# Patient Record
Sex: Female | Born: 1965
Health system: Southern US, Community
[De-identification: ages and names within clinical notes are randomized; demographics above are authoritative.]

## PROBLEM LIST (undated history)

## (undated) DIAGNOSIS — I10 Essential (primary) hypertension: Secondary | ICD-10-CM

## (undated) DIAGNOSIS — E785 Hyperlipidemia, unspecified: Secondary | ICD-10-CM

## (undated) DIAGNOSIS — F909 Attention-deficit hyperactivity disorder, unspecified type: Secondary | ICD-10-CM

## (undated) DIAGNOSIS — C801 Malignant (primary) neoplasm, unspecified: Secondary | ICD-10-CM

## (undated) DIAGNOSIS — I82452 Acute embolism and thrombosis of left peroneal vein: Secondary | ICD-10-CM

## (undated) DIAGNOSIS — G43909 Migraine, unspecified, not intractable, without status migrainosus: Secondary | ICD-10-CM

## (undated) DIAGNOSIS — E890 Postprocedural hypothyroidism: Secondary | ICD-10-CM

## (undated) HISTORY — DX: Attention-deficit hyperactivity disorder, unspecified type: F90.9

## (undated) HISTORY — DX: Acute embolism and thrombosis of left peroneal vein: I82.452

## (undated) HISTORY — PX: APPENDECTOMY: SHX54

## (undated) HISTORY — DX: Hyperlipidemia, unspecified: E78.5

## (undated) HISTORY — DX: Essential (primary) hypertension: I10

## (undated) HISTORY — DX: Malignant (primary) neoplasm, unspecified: C80.1

## (undated) HISTORY — DX: Migraine, unspecified, not intractable, without status migrainosus: G43.909

## (undated) HISTORY — PX: BREAST CYST ASPIRATION: SHX578

## (undated) HISTORY — DX: Postprocedural hypothyroidism: E89.0

## (undated) HISTORY — PX: LASIK: SHX215

---

## 1998-03-18 ENCOUNTER — Ambulatory Visit (HOSPITAL_COMMUNITY): Admission: RE | Admit: 1998-03-18 | Discharge: 1998-03-18 | Payer: Self-pay | Admitting: Gynecology

## 1998-12-29 ENCOUNTER — Other Ambulatory Visit: Admission: RE | Admit: 1998-12-29 | Discharge: 1998-12-29 | Payer: Self-pay | Admitting: Gynecology

## 1999-12-07 ENCOUNTER — Encounter: Admission: RE | Admit: 1999-12-07 | Discharge: 1999-12-07 | Payer: Self-pay | Admitting: Emergency Medicine

## 1999-12-07 ENCOUNTER — Encounter: Payer: Self-pay | Admitting: Emergency Medicine

## 2000-02-03 ENCOUNTER — Other Ambulatory Visit: Admission: RE | Admit: 2000-02-03 | Discharge: 2000-02-03 | Payer: Self-pay | Admitting: Obstetrics and Gynecology

## 2001-02-08 ENCOUNTER — Other Ambulatory Visit: Admission: RE | Admit: 2001-02-08 | Discharge: 2001-02-08 | Payer: Self-pay | Admitting: Gynecology

## 2002-03-20 ENCOUNTER — Other Ambulatory Visit: Admission: RE | Admit: 2002-03-20 | Discharge: 2002-03-20 | Payer: Self-pay | Admitting: Gynecology

## 2002-11-02 ENCOUNTER — Encounter: Payer: Self-pay | Admitting: Gynecology

## 2002-11-02 ENCOUNTER — Inpatient Hospital Stay (HOSPITAL_COMMUNITY): Admission: AD | Admit: 2002-11-02 | Discharge: 2002-11-02 | Payer: Self-pay | Admitting: Gynecology

## 2003-01-09 ENCOUNTER — Ambulatory Visit (HOSPITAL_COMMUNITY): Admission: RE | Admit: 2003-01-09 | Discharge: 2003-01-09 | Payer: Self-pay | Admitting: Gynecology

## 2003-03-17 ENCOUNTER — Ambulatory Visit (HOSPITAL_COMMUNITY): Admission: RE | Admit: 2003-03-17 | Discharge: 2003-03-17 | Payer: Self-pay | Admitting: Gynecology

## 2003-03-17 ENCOUNTER — Encounter (INDEPENDENT_AMBULATORY_CARE_PROVIDER_SITE_OTHER): Payer: Self-pay | Admitting: Specialist

## 2003-09-02 ENCOUNTER — Other Ambulatory Visit: Admission: RE | Admit: 2003-09-02 | Discharge: 2003-09-02 | Payer: Self-pay | Admitting: Gynecology

## 2003-09-30 ENCOUNTER — Inpatient Hospital Stay (HOSPITAL_COMMUNITY): Admission: AD | Admit: 2003-09-30 | Discharge: 2003-09-30 | Payer: Self-pay | Admitting: Gynecology

## 2004-02-19 ENCOUNTER — Inpatient Hospital Stay (HOSPITAL_COMMUNITY): Admission: RE | Admit: 2004-02-19 | Discharge: 2004-02-22 | Payer: Self-pay | Admitting: Gynecology

## 2004-02-23 ENCOUNTER — Encounter: Admission: RE | Admit: 2004-02-23 | Discharge: 2004-03-24 | Payer: Self-pay | Admitting: Gynecology

## 2004-03-25 ENCOUNTER — Encounter: Admission: RE | Admit: 2004-03-25 | Discharge: 2004-04-24 | Payer: Self-pay | Admitting: Gynecology

## 2004-04-07 ENCOUNTER — Other Ambulatory Visit: Admission: RE | Admit: 2004-04-07 | Discharge: 2004-04-07 | Payer: Self-pay | Admitting: Gynecology

## 2004-11-01 ENCOUNTER — Ambulatory Visit: Payer: Self-pay | Admitting: Internal Medicine

## 2004-11-09 ENCOUNTER — Ambulatory Visit: Payer: Self-pay | Admitting: Internal Medicine

## 2005-05-16 ENCOUNTER — Other Ambulatory Visit: Admission: RE | Admit: 2005-05-16 | Discharge: 2005-05-16 | Payer: Self-pay | Admitting: Gynecology

## 2005-09-03 ENCOUNTER — Inpatient Hospital Stay (HOSPITAL_COMMUNITY): Admission: AD | Admit: 2005-09-03 | Discharge: 2005-09-03 | Payer: Self-pay | Admitting: Gynecology

## 2005-10-11 ENCOUNTER — Ambulatory Visit: Payer: Self-pay | Admitting: Internal Medicine

## 2005-11-21 ENCOUNTER — Inpatient Hospital Stay (HOSPITAL_COMMUNITY): Admission: RE | Admit: 2005-11-21 | Discharge: 2005-11-24 | Payer: Self-pay | Admitting: Gynecology

## 2006-01-08 ENCOUNTER — Other Ambulatory Visit: Admission: RE | Admit: 2006-01-08 | Discharge: 2006-01-08 | Payer: Self-pay | Admitting: Gynecology

## 2006-02-08 ENCOUNTER — Ambulatory Visit: Payer: Self-pay | Admitting: Family Medicine

## 2006-03-14 ENCOUNTER — Ambulatory Visit: Payer: Self-pay | Admitting: Internal Medicine

## 2006-04-18 ENCOUNTER — Ambulatory Visit: Payer: Self-pay | Admitting: Internal Medicine

## 2006-06-21 ENCOUNTER — Ambulatory Visit: Payer: Self-pay | Admitting: Internal Medicine

## 2006-07-25 ENCOUNTER — Ambulatory Visit: Payer: Self-pay | Admitting: Internal Medicine

## 2007-01-25 ENCOUNTER — Ambulatory Visit: Payer: Self-pay | Admitting: Internal Medicine

## 2007-03-01 ENCOUNTER — Other Ambulatory Visit: Admission: RE | Admit: 2007-03-01 | Discharge: 2007-03-01 | Payer: Self-pay | Admitting: Gynecology

## 2007-05-01 ENCOUNTER — Ambulatory Visit: Payer: Self-pay | Admitting: Internal Medicine

## 2007-05-01 ENCOUNTER — Encounter: Payer: Self-pay | Admitting: Internal Medicine

## 2007-09-03 ENCOUNTER — Telehealth (INDEPENDENT_AMBULATORY_CARE_PROVIDER_SITE_OTHER): Payer: Self-pay | Admitting: *Deleted

## 2007-09-03 ENCOUNTER — Ambulatory Visit: Payer: Self-pay | Admitting: Family Medicine

## 2008-02-28 ENCOUNTER — Emergency Department (HOSPITAL_COMMUNITY): Admission: EM | Admit: 2008-02-28 | Discharge: 2008-02-28 | Payer: Self-pay | Admitting: Family Medicine

## 2008-02-28 ENCOUNTER — Telehealth (INDEPENDENT_AMBULATORY_CARE_PROVIDER_SITE_OTHER): Payer: Self-pay | Admitting: *Deleted

## 2008-06-12 ENCOUNTER — Other Ambulatory Visit: Admission: RE | Admit: 2008-06-12 | Discharge: 2008-06-12 | Payer: Self-pay | Admitting: Gynecology

## 2008-07-23 ENCOUNTER — Encounter: Admission: RE | Admit: 2008-07-23 | Discharge: 2008-07-23 | Payer: Self-pay | Admitting: Gynecology

## 2009-03-05 ENCOUNTER — Ambulatory Visit: Payer: Self-pay | Admitting: Internal Medicine

## 2009-03-05 DIAGNOSIS — R1011 Right upper quadrant pain: Secondary | ICD-10-CM | POA: Insufficient documentation

## 2009-03-05 DIAGNOSIS — R635 Abnormal weight gain: Secondary | ICD-10-CM | POA: Insufficient documentation

## 2009-03-08 ENCOUNTER — Encounter (INDEPENDENT_AMBULATORY_CARE_PROVIDER_SITE_OTHER): Payer: Self-pay | Admitting: *Deleted

## 2009-03-08 LAB — CONVERTED CEMR LAB
ALT: 15 units/L (ref 0–35)
Bilirubin, Direct: 0.1 mg/dL (ref 0.0–0.3)
Free T4: 0.8 ng/dL (ref 0.6–1.6)
T3, Free: 2.7 pg/mL (ref 2.3–4.2)
TSH: 1.23 microintl units/mL (ref 0.35–5.50)
Total Bilirubin: 0.8 mg/dL (ref 0.3–1.2)

## 2009-03-11 ENCOUNTER — Encounter: Admission: RE | Admit: 2009-03-11 | Discharge: 2009-03-11 | Payer: Self-pay | Admitting: Internal Medicine

## 2009-03-12 ENCOUNTER — Telehealth (INDEPENDENT_AMBULATORY_CARE_PROVIDER_SITE_OTHER): Payer: Self-pay | Admitting: *Deleted

## 2009-03-12 ENCOUNTER — Encounter (INDEPENDENT_AMBULATORY_CARE_PROVIDER_SITE_OTHER): Payer: Self-pay | Admitting: *Deleted

## 2009-03-23 ENCOUNTER — Telehealth: Payer: Self-pay | Admitting: Internal Medicine

## 2009-03-23 DIAGNOSIS — C73 Malignant neoplasm of thyroid gland: Secondary | ICD-10-CM | POA: Insufficient documentation

## 2009-04-02 ENCOUNTER — Encounter (INDEPENDENT_AMBULATORY_CARE_PROVIDER_SITE_OTHER): Payer: Self-pay | Admitting: *Deleted

## 2009-05-06 ENCOUNTER — Encounter: Payer: Self-pay | Admitting: Internal Medicine

## 2009-05-21 ENCOUNTER — Encounter (INDEPENDENT_AMBULATORY_CARE_PROVIDER_SITE_OTHER): Payer: Self-pay | Admitting: Interventional Radiology

## 2009-05-21 ENCOUNTER — Encounter: Payer: Self-pay | Admitting: Internal Medicine

## 2009-05-21 ENCOUNTER — Ambulatory Visit (HOSPITAL_COMMUNITY): Admission: RE | Admit: 2009-05-21 | Discharge: 2009-05-21 | Payer: Self-pay | Admitting: Endocrinology

## 2009-06-03 ENCOUNTER — Telehealth: Payer: Self-pay | Admitting: Internal Medicine

## 2009-06-10 ENCOUNTER — Telehealth (INDEPENDENT_AMBULATORY_CARE_PROVIDER_SITE_OTHER): Payer: Self-pay | Admitting: *Deleted

## 2009-06-17 ENCOUNTER — Encounter: Payer: Self-pay | Admitting: Internal Medicine

## 2009-06-22 ENCOUNTER — Telehealth (INDEPENDENT_AMBULATORY_CARE_PROVIDER_SITE_OTHER): Payer: Self-pay | Admitting: *Deleted

## 2009-07-25 HISTORY — PX: THYROIDECTOMY: SHX17

## 2009-08-04 ENCOUNTER — Encounter: Payer: Self-pay | Admitting: Internal Medicine

## 2009-08-26 ENCOUNTER — Encounter: Payer: Self-pay | Admitting: Internal Medicine

## 2009-09-07 ENCOUNTER — Encounter: Admission: RE | Admit: 2009-09-07 | Discharge: 2009-09-07 | Payer: Self-pay | Admitting: Gynecology

## 2009-09-16 ENCOUNTER — Telehealth (INDEPENDENT_AMBULATORY_CARE_PROVIDER_SITE_OTHER): Payer: Self-pay | Admitting: *Deleted

## 2009-09-29 ENCOUNTER — Ambulatory Visit: Payer: Self-pay | Admitting: Internal Medicine

## 2009-09-29 ENCOUNTER — Telehealth (INDEPENDENT_AMBULATORY_CARE_PROVIDER_SITE_OTHER): Payer: Self-pay | Admitting: *Deleted

## 2009-09-30 ENCOUNTER — Encounter (INDEPENDENT_AMBULATORY_CARE_PROVIDER_SITE_OTHER): Payer: Self-pay | Admitting: *Deleted

## 2009-09-30 ENCOUNTER — Telehealth (INDEPENDENT_AMBULATORY_CARE_PROVIDER_SITE_OTHER): Payer: Self-pay | Admitting: *Deleted

## 2009-09-30 LAB — CONVERTED CEMR LAB: TSH: 1.93 microintl units/mL (ref 0.35–5.50)

## 2009-10-01 ENCOUNTER — Telehealth: Payer: Self-pay | Admitting: Internal Medicine

## 2009-10-20 ENCOUNTER — Encounter: Payer: Self-pay | Admitting: Internal Medicine

## 2009-12-23 ENCOUNTER — Encounter: Payer: Self-pay | Admitting: Internal Medicine

## 2010-02-03 ENCOUNTER — Telehealth (INDEPENDENT_AMBULATORY_CARE_PROVIDER_SITE_OTHER): Payer: Self-pay | Admitting: *Deleted

## 2010-05-11 ENCOUNTER — Encounter: Payer: Self-pay | Admitting: Internal Medicine

## 2010-11-09 ENCOUNTER — Encounter: Payer: Self-pay | Admitting: Internal Medicine

## 2011-01-16 ENCOUNTER — Encounter: Payer: Self-pay | Admitting: Endocrinology

## 2011-01-24 NOTE — Progress Notes (Signed)
Summary: Lab Concerns  Phone Note Call from Patient Call back at Home Phone [phone] Call back at Work Phone [phone]   Caller: Patient Summary of Call: Message left on VM: Patient left message that she faxed over labs and Dr.Hopper was to review and get back with her about them.  Dr.Hopper I see that you signed labs and they were scanned, would you re-review and inform if patient needs appointment to futher discuss or if you have any comments/recommendations.  Marland KitchenShonna Chock  February 03, 2010 12:31 PM   Follow-up for Phone Call        Dr.Hopper called patient and said " several concerns, chlosterol, vit D and she needs appointment if she would like to futher discuss or have the Dr. that ordered them addressed" Follow-up by: Shonna Chock,  February 03, 2010 12:33 PM     Appended Document: Lab Concerns I reviewed these labs & the comments  made by ? Dr Consuelo Pandy of [facility] who ordered these labs. That practitioner made recommendations concerning the results which were out of normal range. The Standard of Care is for the physician who orderds labs to address any abnormalities. I recommend she make appt with Dr Consuelo Pandy , Dr Talmage Nap, Endocrinologist, or with me to discuss any risks & options in person to prevent duplication or misunderstanding. Her health is too important to attempt  to address this by phone. [employer]

## 2011-01-24 NOTE — Letter (Signed)
Summary: [facility]aryng[facility]  [facility]olaryng[facility]   Imported By: Lanelle Bal 05/28/2010 09:51:52  _____________________________________________________________________  External Attachment:    Type:   Image     Comment:   External Document

## 2011-01-24 NOTE — Letter (Signed)
Summary: [facility] Otolaryngology  [facility] Otolaryng[facility]   Imported By: Lanelle Bal 11/25/2010 12:03:11  _____________________________________________________________________  External Attachment:    Type:   Image     Comment:   External Document

## 2011-02-01 ENCOUNTER — Other Ambulatory Visit: Payer: Self-pay | Admitting: Gynecology

## 2011-02-01 DIAGNOSIS — Z1231 Encounter for screening mammogram for malignant neoplasm of breast: Secondary | ICD-10-CM

## 2011-02-08 ENCOUNTER — Ambulatory Visit: Payer: Self-pay

## 2011-02-09 ENCOUNTER — Other Ambulatory Visit: Payer: Self-pay | Admitting: Women's Health

## 2011-02-09 ENCOUNTER — Ambulatory Visit (INDEPENDENT_AMBULATORY_CARE_PROVIDER_SITE_OTHER): Payer: BC Managed Care – PPO | Admitting: Women's Health

## 2011-02-09 ENCOUNTER — Other Ambulatory Visit (HOSPITAL_COMMUNITY): Admission: RE | Admit: 2011-02-09 | Payer: BC Managed Care – PPO | Source: Ambulatory Visit | Admitting: Gynecology

## 2011-02-09 DIAGNOSIS — Z124 Encounter for screening for malignant neoplasm of cervix: Secondary | ICD-10-CM | POA: Insufficient documentation

## 2011-02-09 DIAGNOSIS — Z01419 Encounter for gynecological examination (general) (routine) without abnormal findings: Secondary | ICD-10-CM

## 2011-02-14 ENCOUNTER — Ambulatory Visit
Admission: RE | Admit: 2011-02-14 | Discharge: 2011-02-14 | Disposition: A | Discharge status: Home / Self Care | Payer: BC Managed Care – PPO | Source: Ambulatory Visit | Attending: Gynecology | Admitting: Gynecology

## 2011-02-14 DIAGNOSIS — Z1231 Encounter for screening mammogram for malignant neoplasm of breast: Secondary | ICD-10-CM

## 2011-02-17 ENCOUNTER — Other Ambulatory Visit: Payer: Self-pay | Admitting: Gynecology

## 2011-02-17 DIAGNOSIS — R928 Other abnormal and inconclusive findings on diagnostic imaging of breast: Secondary | ICD-10-CM

## 2011-02-22 ENCOUNTER — Ambulatory Visit
Admission: RE | Admit: 2011-02-22 | Discharge: 2011-02-22 | Disposition: A | Discharge status: Home / Self Care | Payer: BC Managed Care – PPO | Source: Ambulatory Visit | Attending: Gynecology | Admitting: Gynecology

## 2011-02-22 ENCOUNTER — Other Ambulatory Visit: Payer: Self-pay | Admitting: Gynecology

## 2011-02-22 DIAGNOSIS — R928 Other abnormal and inconclusive findings on diagnostic imaging of breast: Secondary | ICD-10-CM

## 2011-02-23 ENCOUNTER — Other Ambulatory Visit: Payer: BC Managed Care – PPO

## 2011-03-20 ENCOUNTER — Ambulatory Visit (HOSPITAL_COMMUNITY): Payer: BC Managed Care – PPO | Admitting: Radiology

## 2011-03-27 ENCOUNTER — Ambulatory Visit (HOSPITAL_COMMUNITY): Payer: BC Managed Care – PPO | Attending: Family Medicine | Admitting: Radiology

## 2011-03-27 DIAGNOSIS — R0602 Shortness of breath: Secondary | ICD-10-CM

## 2011-03-27 DIAGNOSIS — R0789 Other chest pain: Secondary | ICD-10-CM

## 2011-03-27 DIAGNOSIS — R55 Syncope and collapse: Secondary | ICD-10-CM | POA: Insufficient documentation

## 2011-03-27 DIAGNOSIS — R079 Chest pain, unspecified: Secondary | ICD-10-CM | POA: Insufficient documentation

## 2011-03-27 MED ORDER — TECHNETIUM TC 99M TETROFOSMIN IV KIT
11.0000 | PACK | Freq: Once | INTRAVENOUS | Status: AC | PRN
  Administered 2011-03-27: 11 via INTRAVENOUS

## 2011-03-27 MED ORDER — TECHNETIUM TC 99M TETROFOSMIN IV KIT
33.0000 | PACK | Freq: Once | INTRAVENOUS | Status: AC | PRN
  Administered 2011-03-27: 33 via INTRAVENOUS

## 2011-03-27 NOTE — Progress Notes (Signed)
[facility] SITE 3 NUCLEAR MED [address] [place] [address] [phone]  Cardiology Nuclear Med Study  Jennifer Shepard is a 45 y.o. female [id number] 1966-06-08 45 y.o.   Nuclear Med Background Indication for Stress Test:  Evaluation for Ischemia and Abnormal EKG History:  '05 OZH:YQMVHQ, EF=57% Cardiac Risk Factors: Family History - CAD and IRBBB  Symptoms:  Chest Tightness (last date of chest discomfort was last night), Diaphoresis, Near Syncope and Rapid HR   Nuclear Pre-Procedure Caffeine/Decaff Intake:  7:00pm NPO After: 8:00am   Lungs:  Clear IV 0.9% NS with Angio Cath:  18g  IV Site: R Antecubital  IV Started by:  Stanton Kidney, EMT-P  Chest Size (in):  36 Cup Size: D  Height: 5\' 9"  (1.753 m)  Weight:  190 lb (86.183 kg)  BMI:  Body mass index is 28.06 kg/(m^2). Tech Comments:  NA    Nuclear Med Study 1 or 2 day study: 1 day  Stress Test Type:  Stress  Reading MD: Dietrich Pates, MD  Order Authorizing Provider:  Antony Haste, MD  Resting Radionuclide: Technetium 44m Tetrofosmin  Resting Radionuclide Dose: 11 mCi   Stress Radionuclide:  Technetium 15m Tetrofosmin  Stress Radionuclide Dose: 33 mCi           Stress Protocol Rest HR: 84 Stress HR: 179  Rest BP: 126/87 Stress BP: 182/79  Exercise Time (min): 8:00 METS: 10.2          Dose of Adenosine (mg):  n/a Dose of Lexiscan: n/a mg  Dose of Atropine (mg): n/a Dose of Dobutamine:  n/a  Stress Test Technologist: [employer], CMA-N  Nuclear Technologist:  Doyne Keel, CNMT     Rest Procedure:  Myocardial perfusion imaging was performed at rest 45 minutes following the intravenous administration of Technetium 8m Tetrofosmin. Rest ECG: No acute changes.  Stress Procedure:  The patient exercised for eight minutes on the treadmill utilizing the Bruce protocol.  The patient stopped due to fatigue and denied any chest pain.  There were no significant ST-T wave changes.  Technetium 70m  Tetrofosmin was injected at peak exercise and myocardial perfusion imaging was performed after a brief delay. Stress ECG: No significant ST segment change suggestive of ischemia.  QPS Raw Data Images:  Soft tissue (diaphragm, breast) surround heart. Stress Images:  Normal perfusion. Rest Images:  Normal homogeneous uptake in all areas of the myocardium. Subtraction (SDS):  No evidence of ischemia. Transient Ischemic Dilatation (Normal <1.22):  1.04 Lung/Heart Ratio (Normal <0.45):  0.32  Quantitative Gated Spect Images QGS EDV:  124 ml QGS ESV:  57 ml QGS cine images:  LVEF appears greater than calculated. QGS EF: 54%  Impression Exercise Capacity:  Good exercise capacity. BP Response:  Normal blood pressure response. Clinical Symptoms:  No chest pain. ECG Impression:  No significant ST segment change suggestive of ischemia. Comparison with Prior Nuclear Study:   No change from previous report.  Overall Impression:  Normal stress nuclear study.    Dietrich Pates

## 2011-03-28 NOTE — Progress Notes (Signed)
Report faxed to Dr. Antony Haste

## 2011-03-28 NOTE — Progress Notes (Deleted)
NUC REPORT SENT TO DR. MICHAEL BADGER

## 2011-04-06 ENCOUNTER — Encounter (HOSPITAL_COMMUNITY): Payer: BC Managed Care – PPO | Admitting: Radiology

## 2011-05-12 NOTE — Op Note (Signed)
NAMEBRENNEN, Jennifer Shepard              ACCOUNT NO.:  [id number]   MEDICAL RECORD NO.:  [id number]          PATIENT TYPE:  INP   LOCATION:  9114                          [facility]   PHYSICIAN:  Ivor Costa. Farrel Gobble, M.D. DATE OF BIRTH:  1966/08/05   DATE OF PROCEDURE:  11/21/2005  DATE OF DISCHARGE:                                 OPERATIVE REPORT   PREOPERATIVE DIAGNOSES:  1.  Previous cesarean section, for elective repeat.  2.  Low-ling placenta.   POSTOPERATIVE DIAGNOSES:  1.  Previous cesarean section, for elective repeat.  2.  Low-ling placenta.   PROCEDURES:  1.  Repeat cesarean section, low flap transverse.  2.  Cord blood banking.   SURGEON:  Ivor Costa. Farrel Gobble, M.D.   ASSISTANTMarcial Pacas P. Fontaine, M.D.   ANESTHESIA:  Spinal.   IV FLUID:  4 L lactated Ringer's.   ESTIMATED BLOOD LOSS:  450 mL.   URINE OUTPUT:  100 mL clear urine.   FINDINGS:  A viable female in the vertex presentation.  Copious clear amniotic  fluid.  Apgars 9 and 9.  Birth weight 9 pounds 1 ounce.  Normal uterus,  tubes, and ovaries.   COMPLICATIONS:  None.   PATHOLOGY:  None.   PROCEDURE:  The patient was taken to the operating room and spinal  anesthesia was induced, and placed in the supine position with left lateral  displacement, prepped and draped in the usual sterile fashion.  After  adequate anesthesia was ensured, a Pfannenstiel skin incision was made with  a scalpel going through the previous C-section scar, carried through the  underlying layer of fascia with electrocautery.  The fascia was scored.  The  incision was extended laterally.  The superior aspect of the fascial  incision was grasped with Kochers.  The underlying rectus muscles were  dissected off by blunt and sharp dissection.  In a similar fashion, the  superior aspect of the incision was grasped with Kochers and the underlying  rectus muscles were dissected off.  The rectus muscles were naturally  separated in the  midline.  The peritoneum was entered bluntly.  The  peritoneal incision was then extended superiorly and inferiorly with good  visualization of the underlying bowel and bladder.  The bladder blade was  inserted and the vesicouterine peritoneum was identified, tented up and  entered sharply with the Metzenbaums.  The incision was extended laterally.  The bladder flap was created digitally.  The lower uterine segment was  incised in a transverse fashion with a scalpel.  An amniotomy was performed.  The incision was extended bluntly.  The infant was delivered with the aid of  baby Elliotts, the cord was clamped and cut, and handed off to the awaiting  pediatrician.  Cord blood was obtained for cord blood banking under sterile  conditions for a total of 180 mL, as well as fetal cord blood.  The uterus  was massaged and the placenta allowed to separate naturally.  The uterus was  cleared of all clots and debris.  The uterine incision was repaired with a  running locked layer  of 0 chromic and a second incision was used for  imbrication.  There was some bleeding at the angle that was treated with a  figure-of-eight of 2-0 Vicryl.  The pelvis was then irrigated with copious  amounts of warm saline.  The adnexa were inspected and found to be  unremarkable.  The fascia was then closed with 0 Vicryl in a running  fashion.  The subcu was irrigated and reapproximated where appropriate.  The  skin was closed with 4-0 Vicryl on a Mellody Dance.  Dermabond was placed  afterwards.  The patient tolerated the procedure well.  Sponge, instrument  and needle counts correct x2.  She received Clindamycin intraoperatively for  a PENICILLIN allergy.      Ivor Costa. Farrel Gobble, M.D.  Electronically Signed     THL/MEDQ  D:  11/21/2005  T:  11/21/2005  Job:  [id number]

## 2011-05-12 NOTE — Op Note (Signed)
NAME:  Jennifer Shepard, Jennifer Shepard                        ACCOUNT NO.:  [id number]   MEDICAL RECORD NO.:  [id number]                   PATIENT TYPE:  INP   LOCATION:  9125                                 [facility]   PHYSICIAN:  Ivor Costa. Farrel Gobble, M.D.              DATE OF BIRTH:  [birth year]   DATE OF PROCEDURE:  02/19/2004  DATE OF DISCHARGE:                                 OPERATIVE REPORT   PREOPERATIVE DIAGNOSES:  1. Frank breech.  2. Intrauterine pregnancy at 38-1/2 weeks.   POSTOPERATIVE DIAGNOSES:  1. Frank breech.  2. Intrauterine pregnancy at 38-1/2 weeks.   PROCEDURE:  Primary cesarean section, low flap transverse.   SURGEON:  Ivor Costa. Farrel Gobble, M.D.   ASSISTANTMarcial Pacas P. Fontaine, M.D.   ANESTHESIA:  Spinal.   FLUIDS REPLACED:  2 L lactated Ringer's.   ESTIMATED BLOOD LOSS:  400 mL.   URINE OUTPUT:  150 mL of clear urine.   FINDINGS:  A viable female infant in the frank breech presentation, clear  amniotic fluid.  Apgars 9 and 9, birth weight 8 pounds 1 ounce.  Normal  uterus, tubes, and ovaries.   COMPLICATIONS:  None.   PATHOLOGY:  None.   DESCRIPTION OF PROCEDURE:  The patient was taken to the operating room,  spinal anesthesia was induced, and placed in the dorsal lithotomy position  and prepped and draped in the usual sterile fashion.  A Pfannenstiel skin  incision was made with a scalpel, carried through to the underlying fascia  with electrocautery, and the fascia was scored in the midline.  The incision  was extended laterally with electrocautery.  The inferior aspect of the  fascial incision was grasped with Kochers.  The underlying rectus muscles  were dissected off by blunt and sharp dissection in a similar fashion.  The  superior aspect of the incision was grasped with the Kochers, the underlying  rectus muscles were dissected off.  The rectus muscles were separated in the  midline and the peritoneum was identified and entered sharply.  The  peritoneal incision was entered both superiorly and inferiorly with good  visualization of the underlying bowel and bladder.  The orientation of the  uterus was confirmed, the bladder blade was inserted, and the vesicouterine  peritoneum was identified and tended up, entered sharply with the  Metzenbaums.  The incision was extended laterally, the bladder flap was  created digitally.  The bladder blade was then reinserted and the lower  uterine segment incised in a transverse fashion with the scalpel.  The  infant was delivered from the footling breech presentation.  The cord was  clamped and cut and handed off to awaiting pediatricians.  Cord bloods were  obtained.  The uterus was massaged.  The placenta was removed manually.  The  uterus was then cleared of all clots and debris.  The uterine incision was  repaired with a running locked layer of  0 chromic.  A small amount of  bleeding at the angle was treated with a figure-of-eight and was noted to be  hemostatic after being watched for several minutes.  The gutters were  cleared of all clots and debris.  The adnexa were inspected and noted to be  unremarkable.  Reinspection of the lower uterine segment and the angle  assured [place] of hemostasis without any further extension of the slight angle  hematoma.  The inspection of the fascia, muscle, and peritoneum assured [place]  of hemostasis.  The fascia was then closed with 0 Vicryl in a running  fashion.  The subcu was irrigated.  The dead space was reapproximated with 3-  0 plain.  The skin was closed with staples.  The patient tolerated the  procedure well.  Sponge, lap, and needle counts correct x2.  She was  transferred to the PACU in stable condition.                                               Ivor Costa. Farrel Gobble, M.D.    Leda Roys  D:  02/19/2004  T:  02/19/2004  Job:  [id number]

## 2011-05-12 NOTE — Discharge Summary (Signed)
NAME:  Jennifer Shepard, Jennifer Shepard                        ACCOUNT NO.:  [id number]   MEDICAL RECORD NO.:  [id number]                   PATIENT TYPE:  INP   LOCATION:  9125                                 [facility]   PHYSICIAN:  Juan H. Lily Peer, M.D.             DATE OF BIRTH:  1966/05/12   DATE OF ADMISSION:  02/19/2004  DATE OF DISCHARGE:  02/22/2004                                 DISCHARGE SUMMARY   DISCHARGE DIAGNOSES:  1. Frank breech intrauterine pregnancy at 38-and-a-half weeks, delivered.  2. Advanced maternal age.  3. History of herpes simplex virus.  4. History of recurrent miscarriages.  5. Status post primary cesarean section low flap transverse by Dr. Douglass Rivers on February 19, 2004.   HISTORY:  This is a 36-years-of-age female gravida 3 para 0 with an EDC of  March 02, 2004.  Prenatal course had been complicated by advanced maternal  age; the patient declined amniocentesis.  Also, she had a history of HSV  type 2, had seen an outbreak in October.  She also had a history of  recurrent miscarriages.  The patient had been on first-trimester  progesterone suppositories as well as baby aspirin.  Was noted to have on a  biophysical profile to be frank breech presentation.  The patient had been  offered and declined an external cephalic version and would prefer to have a  primary cesarean section.   HOSPITAL COURSE:  On February 19, 2004 the patient was admitted and  underwent a primary cesarean section, low flap transverse, by Dr. Douglass Rivers.  Underwent delivery of a female, Apgars of 9 and 9, weight of 8  pounds and 1 ounce.  There were no complications.  Postoperatively the  patient remained afebrile, voiding, stable condition, and she was discharged  to home on February 02, 2004 and [facility] Gynecology postpartum  instructions and postpartum booklet.   ACCESSORY CLINICAL FINDINGS/LABORATORY DATA:  The patient is O positive,  rubella immune.  On February 20, 2004  hemoglobin was 9.9.   DISPOSITION:  The patient was discharged to home, informed to return to the  office in 6 weeks, if had any problem prior to that time to be seen in the  office.  Was given a prescription for Tylox p.r.n. pain.     Susa Loffler, P.A.                    Juan H. Lily Peer, M.D.    TSG/MEDQ  D:  03/21/2004  T:  03/21/2004  Job:  [id number]

## 2011-05-12 NOTE — Discharge Summary (Signed)
NAMEGLORIS, SHIROMA              ACCOUNT NO.:  [id number]   MEDICAL RECORD NO.:  [id number]          PATIENT TYPE:  INP   LOCATION:  9114                          [facility]   PHYSICIAN:  Timothy P. Fontaine, M.D.DATE OF BIRTH:  Feb 16, 1966   DATE OF ADMISSION:  11/21/2005  DATE OF DISCHARGE:                                 DISCHARGE SUMMARY   DISCHARGE DIAGNOSES:  1.  Pregnancy at term.  2.  Prior cesarean section, desires repeat cesarean section.  3.  Low-lying placenta.   PROCEDURE:  Repeat low transverse cervical cesarean section, November 21, 2005, Dr. Douglass Rivers.   HOSPITAL COURSE:  A 45 year old G55 P1 female at term gestation, history of  prior cesarean section who desires repeat cesarean section. She was noted to  have a low-lying placenta antepartum with ultrasound evaluation. The patient  underwent an uncomplicated repeat low transverse cervical cesarean section  November 21, 2005. Her postoperative course was uncomplicated and she was  discharged on postoperative day #3 ambulating well, tolerating a regular  diet, with a postoperative hemoglobin of 10.1. The patient's blood type is O  positive, she is rubella titer positive. The patient received precautions,  instructions and follow-up, will be seen in the office in 6 weeks following  discharge. Pain management was reviewed and the patient is only taking  Motrin in the hospital and prefers no other medication. The patient knows to  call if she does have increasing discomfort that requires medicines stronger  than Motrin.      Timothy P. Fontaine, M.D.  Electronically Signed     TPF/MEDQ  D:  11/24/2005  T:  11/24/2005  Job:  [id number]

## 2011-05-12 NOTE — H&P (Signed)
   NAME:  Jennifer Shepard, Jennifer Shepard                        ACCOUNT NO.:  [id number]   MEDICAL RECORD NO.:  [id number]                   PATIENT TYPE:  AMB   LOCATION:  SDC                                  [facility]   PHYSICIAN:  Ivor Costa. Farrel Gobble, M.D.              DATE OF BIRTH:  Oct 24, 1966   DATE OF ADMISSION:  03/16/2003  DATE OF DISCHARGE:                                HISTORY & PHYSICAL   CHIEF COMPLAINT:  Endometrial polyp.   HISTORY OF PRESENT ILLNESS:  The patient is a 45 year old G2, P0, with a  history of recurrent pregnancy loss.  She had an evaluation including  multiple blood tests, all of which were negative.  The patient also had a  sonohistogram which showed the presence of an anterior wall polyp that  measured 9 x 14 mm.  She presents electively for [facility] hysteroscopy.  She is  without any other complaints.  The remainder of her lab work has been  normal.   PHYSICAL EXAMINATION:  GENERAL:  On examination she is a well-appearing  female in no acute distress.  HEART:  Her heart is regular rate.  LUNGS:  Her lungs are clear to auscultation.  ABDOMEN:  Soft, nontender; without rebound or guarding.  GYNECOLOGIC:  She has normal external genitalia.  The BUS is negative.  The  vagina is pink and moist.  The cervix is without lesion.  BIMANUAL:  The uterus was anteverted; normal size, shape and contour.  Adnexa without tenderness or fullness.  Rectovaginal exam is confirmatory.   PAST OBSTETRIC AND GYNECOLOGIC HISTORY:  Significant for regular menses.  Recurrent first trimester AB x2.   PAST MEDICAL HISTORY:  Negative.   SURGICAL HISTORY:  Significant for D&C for a missed AB earlier this year.   MEDICATIONS:  Prenatal vitamins and baby aspirin.   ALLERGIES:  TYLOX, CECLOR.   DIAGNOSTIC LABS:  Ultrasound:  The uterus is 9 x 4 x 6 with echogenic foci  as mentioned above.   ASSESSMENT:  Recurrent AB with anterior endometrial polyp.   PLAN:  For D&C hysteroscopy.  A  luminary was placed the day before.  Risks  and benefits were discussed.  All questions were addressed.   She is given a prescription for Darvocet N 100 to treat postoperative pain.                                              Ivor Costa. Farrel Gobble, M.D.   THL/MEDQ  D:  03/16/2003  T:  03/16/2003  Job:  [id number]

## 2011-05-12 NOTE — H&P (Signed)
NAME:  Jennifer Shepard, Jennifer Shepard                        ACCOUNT NO.:  [id number]   MEDICAL RECORD NO.:  [id number]                   PATIENT TYPE:  INP   LOCATION:  NA                                   [facility]   PHYSICIAN:  Ivor Costa. Farrel Gobble, M.D.              DATE OF BIRTH:  1966/05/21   DATE OF ADMISSION:  02/18/2004  DATE OF DISCHARGE:                                HISTORY & PHYSICAL   CHIEF COMPLAINT:  [name] breech.   HISTORY OF PRESENT ILLNESS:  The patient is a 45 year old gravida 3, para 0,  with an LMP of May 29, 2003, estimated date of confinement of March 02, 2004,  estimated gestational age of 38-1/2 weeks, who presented to the office for a  routine visit complaining of decreased fetal movement.  The patient had an  NST that was assuring, but nonreactive.  She then had a GYN ultrasound for a  biophysical profile which was an 8 out of 8, but noted the baby to be a  frank breech presentation.  The patient had been offered, but declined an  ECV and would prefer instead to have a primary cesarean section.  Her  pregnancy is complicated by recurrent miscarriages for which the baby is on  baby aspirin, advanced maternal age the patient had been offered, but  declined an amniocentesis, and had a history of herpes simplex virus II for  which she had a single outbreak in October.  Because of her recurrent  miscarriages, the patient had been on first trimester progesterone  suppositories as well as baby aspirin daily.  The patient reports no vaginal  bleeding and no contractions.   PRENATAL LABORATORY DATA:  O positive, antibody negative, RPR nonreactive,  rubella immune, hepatitis B surface antigen nonreactive, HIV nonreactive,  GBS positive.   PHYSICAL EXAMINATION:  GENERAL:  She is a well-appearing gravida in no acute  distress.  VITAL SIGNS:  Weight 240 pounds and she has a 27 pound weight gain, blood  pressure 120/82.  HEART:  Regular rate and rhythm.  LUNGS:  Clear to  auscultation.  ABDOMEN:  Gravid with a fundal height of 37 cm.  PELVIC:  Vaginal examination was long, closed, and posterior.  EXTREMITIES:  Trace edema.   ASSESSMENT:  [name] breech presentation at 38-1/2 weeks for elective primary  cesarean section.  The patient will present on the afternoon of February 25,  for surgery.                                               Ivor Costa. Farrel Gobble, M.D.    Leda Roys  D:  02/18/2004  T:  02/18/2004  Job:  [id number]

## 2011-05-12 NOTE — Op Note (Signed)
   NAME:  Jennifer Shepard, Jennifer Shepard                        ACCOUNT NO.:  [id number]   MEDICAL RECORD NO.:  [id number]                   PATIENT TYPE:  AMB   LOCATION:  SDC                                  [facility]   PHYSICIAN:  Ivor Costa. Farrel Gobble, M.D.              DATE OF BIRTH:  1966-11-22   DATE OF PROCEDURE:  03/17/2003  DATE OF DISCHARGE:                                 OPERATIVE REPORT   PREOPERATIVE DIAGNOSES:  1. Recurrent abortion.  2. Endometrial polyp.   POSTOPERATIVE DIAGNOSES:  1. Recurrent abortion.  2. Endometrial polyp.   PROCEDURE:  1. Dilatation and curettage.  2. Hysteroscopy.   SURGEON:  Ivor Costa. Farrel Gobble, M.D.   ANESTHESIA:  MAC and paracervical block with 10 mL of 0.5% Marcaine solution  with epinephrine.   ESTIMATED BLOOD LOSS:  Minimal.   I&O DEFICIT:  3% sorbitol solution was approximately 90.   FINDINGS:  Questionable endometrial polyps and normal cavity contours.   PATHOLOGY:  Endometrial curettings.   PROCEDURE:  The patient was taken to the operating room.  IV sedation was  induced.  Laminaria placed the evening before was removed and prepped and  draped in the usual sterile fashion.  Bivalve speculum was placed in the  vagina.  The cervix was visualized and a paracervical block was placed as  mentioned above.  The bivalve was then removed.  The sterile weighted  speculum and single tooth tenaculum were then placed.  The cervix was noted  to be dilated prior to the procedure as a result of the laminaria.  The  uterus sounded to 7.  The cavity was noted to be dilated greater than 31 so  an operative hysteroscope was advanced through the cervix.  The cavity was  cleared of any blood.  There appeared to be some irregularities in the  cavity; however, no grossly evident polyp.  A gentle curetting revealed what  may have been polypoid type tissue.  This was sent off to pathology.  Reinsertion of the hysteroscope confirmed clearance of the cavity  abnormalities that were noted.  The instruments were then removed.  The  patient was extubated in the OR, transferred to PACU in stable condition.  She will follow up in the office in two weeks.  She is given Darvocet for  postoperative pain management.                                               Ivor Costa. Farrel Gobble, M.D.   THL/MEDQ  D:  03/17/2003  T:  03/17/2003  Job:  [id number]

## 2012-02-08 DIAGNOSIS — B009 Herpesviral infection, unspecified: Secondary | ICD-10-CM | POA: Insufficient documentation

## 2012-02-08 DIAGNOSIS — A630 Anogenital (venereal) warts: Secondary | ICD-10-CM | POA: Insufficient documentation

## 2012-02-08 DIAGNOSIS — G43909 Migraine, unspecified, not intractable, without status migrainosus: Secondary | ICD-10-CM | POA: Insufficient documentation

## 2012-02-14 ENCOUNTER — Encounter: Payer: BC Managed Care – PPO | Admitting: Women's Health

## 2012-02-23 ENCOUNTER — Ambulatory Visit (INDEPENDENT_AMBULATORY_CARE_PROVIDER_SITE_OTHER): Payer: BC Managed Care – PPO | Admitting: Women's Health

## 2012-02-23 ENCOUNTER — Encounter: Payer: Self-pay | Admitting: Women's Health

## 2012-02-23 VITALS — BP 128/84 | Ht 69.25 in | Wt 198.0 lb

## 2012-02-23 DIAGNOSIS — B009 Herpesviral infection, unspecified: Secondary | ICD-10-CM

## 2012-02-23 DIAGNOSIS — Z01419 Encounter for gynecological examination (general) (routine) without abnormal findings: Secondary | ICD-10-CM

## 2012-02-23 DIAGNOSIS — Z833 Family history of diabetes mellitus: Secondary | ICD-10-CM

## 2012-02-23 DIAGNOSIS — Z13220 Encounter for screening for lipoid disorders: Secondary | ICD-10-CM

## 2012-02-23 LAB — CBC WITH DIFFERENTIAL/PLATELET
Basophils Absolute: 0 10*3/uL (ref 0.0–0.1)
Basophils Relative: 1 % (ref 0–1)
Lymphocytes Relative: 28 % (ref 12–46)
MCHC: 32.3 g/dL (ref 30.0–36.0)
Neutro Abs: 3.4 10*3/uL (ref 1.7–7.7)
Platelets: 220 10*3/uL (ref 150–400)
RDW: 14 % (ref 11.5–15.5)
WBC: 5.6 10*3/uL (ref 4.0–10.5)

## 2012-02-23 LAB — LIPID PANEL
Cholesterol: 197 mg/dL (ref 0–200)
LDL Cholesterol: 103 mg/dL — ABNORMAL HIGH (ref 0–99)
Triglycerides: 43 mg/dL (ref <150)

## 2012-02-23 MED ORDER — VALACYCLOVIR HCL 500 MG PO TABS: 500.0000 mg | ORAL_TABLET | Freq: Two times a day (BID) | ORAL | Status: AC

## 2012-02-23 NOTE — Progress Notes (Signed)
Jennifer Shepard 06-27-1966 [id number]    History:    The patient presents for annual exam.  Monthly 7 day cycle, not sexually active. Rare HSV outbreaks. History of normal Paps and is due for mammogram. Hypothyroid-history of the cancerous thyroid nodule with thyroidectomy in 2010/ endocrinologist manages.  Past medical history, past surgical history, family history and social history were all reviewed and documented in the EPIC chart. Process of a divorce, Joni Reining 9, Marlene Bast 7, doing okay.   ROS:  A  ROS was performed and pertinent positives and negatives are included in the history.  Exam:  Filed Vitals:   02/23/12 0918  BP: 128/84    General appearance:  Normal Head/Neck:  Normal, without cervical or supraclavicular adenopathy. Thyroid:  Symmetrical, normal in size, without palpable masses or nodularity. Respiratory  Effort:  Normal  Auscultation:  Clear without wheezing or rhonchi Cardiovascular  Auscultation:  Regular rate, without rubs, murmurs or gallops  Edema/varicosities:  Not grossly evident Abdominal  Soft,nontender, without masses, guarding or rebound.  Liver/spleen:  No organomegaly noted  Hernia:  None appreciated  Skin  Inspection:  Grossly normal  Palpation:  Grossly normal Neurologic/psychiatric  Orientation:  Normal with appropriate conversation.  Mood/affect:  Normal  Genitourinary    Breasts: Examined lying and sitting.     Right: Without masses, retractions, discharge or axillary adenopathy.     Left: Without masses, retractions, discharge or axillary adenopathy.   Inguinal/mons:  Normal without inguinal adenopathy  External genitalia:  Normal  BUS/Urethra/Skene's glands:  Normal  Bladder:  Normal  Vagina:  Normal  Cervix:  Normal  Uterus:   normal in size, shape and contour.  Midline and mobile  Adnexa/parametria:     Rt: Without masses or tenderness.   Lt: Without masses or tenderness.  Anus and perineum: Normal  Digital rectal exam: Normal  sphincter tone without palpated masses or tenderness  Assessment/Plan:  46 y.o. separated WF G4 P2  for annual exam.  Rare HSV outbreaks Hypothyroidism-endocrinologist labs and meds  Plan:  Valtrex 500 mg prescription given, twice a day when necessary for outbreaks. SBE's, schedule mammogram, calcium rich diet, condoms encouraged if becomes sexually active. Currently in counseling for impending divorce, declines need for medication. CBC, glucose, lipid profile, UAHarrington Challenger WHNP, 5:49 PM 02/23/2012

## 2012-02-23 NOTE — Patient Instructions (Signed)

## 2012-02-24 LAB — URINALYSIS W MICROSCOPIC + REFLEX CULTURE
Bacteria, UA: NONE SEEN
Bilirubin Urine: NEGATIVE
Casts: NONE SEEN
Ketones, ur: NEGATIVE mg/dL
Nitrite: NEGATIVE
Specific Gravity, Urine: 1.021 (ref 1.005–1.030)
Urobilinogen, UA: 0.2 mg/dL (ref 0.0–1.0)
pH: 6.5 (ref 5.0–8.0)

## 2012-03-05 ENCOUNTER — Other Ambulatory Visit: Payer: Self-pay | Admitting: Women's Health

## 2012-03-05 ENCOUNTER — Other Ambulatory Visit: Payer: Self-pay | Admitting: Gynecology

## 2012-03-05 DIAGNOSIS — Z1231 Encounter for screening mammogram for malignant neoplasm of breast: Secondary | ICD-10-CM

## 2012-03-19 ENCOUNTER — Ambulatory Visit
Admission: RE | Admit: 2012-03-19 | Discharge: 2012-03-19 | Disposition: A | Discharge status: Home / Self Care | Payer: BC Managed Care – PPO | Source: Ambulatory Visit | Attending: Gynecology | Admitting: Gynecology

## 2012-03-19 DIAGNOSIS — Z1231 Encounter for screening mammogram for malignant neoplasm of breast: Secondary | ICD-10-CM

## 2013-05-15 ENCOUNTER — Encounter: Payer: Self-pay | Admitting: Internal Medicine

## 2013-05-15 ENCOUNTER — Ambulatory Visit (INDEPENDENT_AMBULATORY_CARE_PROVIDER_SITE_OTHER): Payer: BC Managed Care – PPO | Admitting: Internal Medicine

## 2013-05-15 VITALS — BP 138/96 | HR 111 | Temp 98.3°F | Wt 195.0 lb

## 2013-05-15 DIAGNOSIS — R002 Palpitations: Secondary | ICD-10-CM

## 2013-05-15 DIAGNOSIS — R0989 Other specified symptoms and signs involving the circulatory and respiratory systems: Secondary | ICD-10-CM

## 2013-05-15 DIAGNOSIS — R0789 Other chest pain: Secondary | ICD-10-CM

## 2013-05-15 NOTE — Patient Instructions (Addendum)
To prevent palpitations or premature beats, avoid stimulants such as decongestants, diet pills, nicotine, or caffeine (coffee, tea, cola, or chocolate) to excess. Cardiovascular exercise, this can be as simple a program as walking, is recommended 30-45 minutes 3-4 times per week. If you're not exercising you should take 6-8 weeks to build up to this level.  If you activate the  My Chart system; lab & Xray results will be released directly  to you as soon as I review & address these through the computer. If you choose not to sign up for My Chart within 36 hours of labs being drawn; results will be reviewed & interpretation added before being copied & mailed, causing a delay in getting the results to you.If you do not receive that report within 7-10 days ,please call. Additionally you can use this system to gain direct  access to your records  if  out of town or @ an office of a  physician who is not in  the My Chart network.  This improves continuity of care & places you in control of your medical record.  Share results with all non  medical staff seen

## 2013-05-15 NOTE — Progress Notes (Signed)
  Subjective:    Patient ID: Jennifer Shepard, female    DOB: 05-20-1966, 47 y.o.   MRN: [id number]  HPI Jennifer Shepard is here for  atypical chest discomfort.     Review of Systems She believes she may have had symptoms as far back as 2006. Certainly this is been an intermittent issue over the last year, occuring once a month typically. She denies any pain but has a "sensation" or "fluttering" in the epigastric/substernal area which occurs without specific trigger & lasts 5 days 10 minutes. It is not exacerbated by exercise, position, or eating. She has not treated this with any medications.   The symptoms are not associated with dyspepsia, dysphagia, unexplained weight loss, melena, rectal bleeding. She has no dyspnea,cough, hemoptysis, or syncope.  Stress Myoview was negative in 2012.She is not on a heart healthy diet; she does not exercise.  Family history is negative for premature coronary disease .     Objective:   Physical Exam Gen.: Healthy and well-nourished in appearance. Alert, appropriate and cooperative throughout exam.Appears younger than stated age  Head: Normocephalic without obvious abnormalities  Eyes: No corneal or conjunctival inflammation noted.  Extraocular motion intact. Vision grossly normal without lenses Mouth: Oral mucosa and oropharynx reveal no lesions or exudates. Teeth in good repair. Neck: No deformities, masses, or tenderness noted. Range of motion normal. Thyroid absent. Lungs: Normal respiratory effort; chest expands symmetrically. Lungs are clear to auscultation without rales, wheezes, or increased work of breathing. Heart: Normal rate and rhythm. Normal S1 and S2. No gallop, click, or rub. S4 w/o murmur. Abdomen: Bowel sounds normal; abdomen soft and nontender. No masses, organomegaly or hernias noted. Aortic bruit present; no aortic enlargement.                             Musculoskeletal/extremities: No deformity or scoliosis noted of  the thoracic or lumbar  spine.  No clubbing, cyanosis, edema, or significant extremity  deformity noted. Range of motion normal .Tone & strength  Normal. Joints  Reveal minor flexion changes. Nail health good. Able to lie down & sit up w/o help. Negative SLR bilaterally Vascular: Carotid, radial artery, dorsalis pedis and  posterior tibial pulses are full and equal. Neurologic: Alert and oriented x3. Deep tendon reflexes symmetrical and normal.         Skin: Intact without suspicious lesions or rashes. Lymph: No cervical, axillary lymphadenopathy present. Psych: Mood and affect are normal. Normally interactive                                                                                        Assessment & Plan:  #1 atypical chest discomfort; history this will suggest that she is having intermittent tachyarrhythmias Plan: event monitor would be necessary

## 2013-05-27 ENCOUNTER — Telehealth: Payer: Self-pay | Admitting: Internal Medicine

## 2013-05-27 NOTE — Telephone Encounter (Signed)
[facility] Triage Call Report Triage Record Num: [id number] Operator: Kelle Darting Patient Name: Jennifer Shepard Call Date & Time: 05/12/2013 5:09:32PM Patient Phone: [phone] PCP: Marga Melnick Patient Gender: Female PCP Fax : [phone] Patient DOB: 28-Dec-1965 Practice Name: [facility] Reason for Call: Caller: [name]/Patient; PCP: Marga Melnick; CB#: [phone]; Call regarding Chest discomfort, none at this time; LMP: 05/06/13; Afebrile; Onset: 1-2 months ago; Sx notes: Having intermittent chest discomfort that is mid sternal, has also felt like her heart skips a beat but is not during the chest discomfort, had this chest discomfort about 1.5 hour ago that she noticed for 30 minutes, did not interfere with her work, was able to continue working, denies any injuries or breathing problems, states that she had the discomfort today and yesterday but then none in the past week; Guideline used: Chest pain; Disposition: See provider within 72 hours due to all other situations, all emergent questions negative; Appt. made: No, patient to call back in morning for an appt. on either Wed. or Thurs. Protocol(s) Used: Chest Pain Recommended Outcome per Protocol: See Provider within 72 Hours Reason for Outcome: All other situations Care Advice: CALL EMS 911 if chest pain/discomfort lasts five minutes or more; may or may not also have pain/discomfort in one or both arms, the back, neck, jaw or stomach and/or shortness of breath, sweating, nausea or lightheadedness. ~ Better lifestyle habits can help reduce the risk of heart disease. Define a plan with your provider that works for you to include: - Eat healthy -- reduce trans fats, eat more fruits, vegetables and whole grains. - Manage weight -- lose weight if overweight, even 10 pounds helps. - Exercise -- 30 minutes of moderate exercise on most days. - Avoid second hand smoke; if you smoke, obtain help to stop. -  Limit alcohol, no more than 1 drink per day for women or 2 drinks per day for men. ~ 05/

## 2013-05-27 NOTE — Telephone Encounter (Signed)
Patient was seen on 05/15/2013

## 2013-05-31 ENCOUNTER — Other Ambulatory Visit: Payer: Self-pay | Admitting: Internal Medicine

## 2013-05-31 DIAGNOSIS — R0789 Other chest pain: Secondary | ICD-10-CM

## 2013-05-31 DIAGNOSIS — R002 Palpitations: Secondary | ICD-10-CM

## 2013-06-12 ENCOUNTER — Other Ambulatory Visit (INDEPENDENT_AMBULATORY_CARE_PROVIDER_SITE_OTHER): Payer: BC Managed Care – PPO

## 2013-06-12 DIAGNOSIS — C73 Malignant neoplasm of thyroid gland: Secondary | ICD-10-CM

## 2013-06-12 LAB — TSH: TSH: 0.05 u[IU]/mL — ABNORMAL LOW (ref 0.35–5.50)

## 2013-06-13 ENCOUNTER — Encounter: Payer: Self-pay | Admitting: *Deleted

## 2013-06-13 LAB — THYROGLOBULIN ANTIBODY: Thyroglobulin Ab: <20 U/mL (ref <40.0)

## 2013-06-23 ENCOUNTER — Institutional Professional Consult (permissible substitution): Payer: BC Managed Care – PPO | Admitting: Cardiovascular Disease

## 2013-07-09 ENCOUNTER — Encounter: Payer: Self-pay | Admitting: Women's Health

## 2013-07-09 ENCOUNTER — Ambulatory Visit (INDEPENDENT_AMBULATORY_CARE_PROVIDER_SITE_OTHER): Payer: BC Managed Care – PPO | Admitting: Women's Health

## 2013-07-09 DIAGNOSIS — N898 Other specified noninflammatory disorders of vagina: Secondary | ICD-10-CM

## 2013-07-09 DIAGNOSIS — Z113 Encounter for screening for infections with a predominantly sexual mode of transmission: Secondary | ICD-10-CM

## 2013-07-09 DIAGNOSIS — L293 Anogenital pruritus, unspecified: Secondary | ICD-10-CM

## 2013-07-09 LAB — WET PREP FOR TRICH, YEAST, CLUE
Clue Cells Wet Prep HPF POC: NONE SEEN
Yeast Wet Prep HPF POC: NONE SEEN

## 2013-07-09 NOTE — Progress Notes (Signed)
Patient ID: Jennifer Shepard, female   DOB: [birth year], 47 y.o.   MRN: [id number] Presents with the plan of questionable yeast infection. States treated self with over-the-counter Monistat and vaginal irritation/itching is now better but wanted to be sure. Sexually active with a new partner. Monthly cycle/condoms. Denies urinary symptoms.  Exam: Appears well, external genitalia within normal limits, speculum exam scant white discharge minimal erythema. Wet prep negative. GC/Chlamydia culture taken care  Resolved yeast vaginitis STD screen  Plan: GC/Chlamydia culture pending, will check HIV hepatitis and RPR at annual exam in 2 weeks.

## 2013-07-17 ENCOUNTER — Other Ambulatory Visit: Payer: Self-pay

## 2013-07-17 DIAGNOSIS — Z1231 Encounter for screening mammogram for malignant neoplasm of breast: Secondary | ICD-10-CM

## 2013-07-23 ENCOUNTER — Encounter: Payer: Self-pay | Admitting: Women's Health

## 2013-08-11 ENCOUNTER — Ambulatory Visit (INDEPENDENT_AMBULATORY_CARE_PROVIDER_SITE_OTHER): Payer: BC Managed Care – PPO | Admitting: Cardiovascular Disease

## 2013-08-11 ENCOUNTER — Encounter: Payer: Self-pay | Admitting: Cardiovascular Disease

## 2013-08-11 VITALS — BP 150/84 | HR 90 | Ht 69.0 in | Wt 203.0 lb

## 2013-08-11 DIAGNOSIS — R002 Palpitations: Secondary | ICD-10-CM

## 2013-08-11 DIAGNOSIS — R0989 Other specified symptoms and signs involving the circulatory and respiratory systems: Secondary | ICD-10-CM

## 2013-08-11 DIAGNOSIS — R0600 Dyspnea, unspecified: Secondary | ICD-10-CM

## 2013-08-11 DIAGNOSIS — R0609 Other forms of dyspnea: Secondary | ICD-10-CM

## 2013-08-11 DIAGNOSIS — C73 Malignant neoplasm of thyroid gland: Secondary | ICD-10-CM

## 2013-08-11 NOTE — Assessment & Plan Note (Signed)
Clearly her relative tachycardia and likely intermitant heart sensations may be from suppressed TSH and relative hyperthyroid state  She did not want beta blocker since symptoms infrequent.  Can offer in future if needed  Asked her to discuss lowering dose with [facility] doctor

## 2013-08-11 NOTE — Patient Instructions (Signed)

## 2013-08-11 NOTE — Progress Notes (Signed)
Patient ID: Jennifer Shepard, female   DOB: 12/01/1966, 47 y.o.   MRN: [id number] 47 yo referred by family medicine for atypical chest pain.  She believes she may have had symptoms as far back as 2006. Certainly this is been an intermittent issue over the last year, occuring once a month typically. She denies any pain but has a "sensation" or "fluttering" in the epigastric/substernal area which occurs without specific trigger.  Can have it days in a row and last minutes at a time  It is not exacerbated by exercise, position, or eating. She has not treated this with any medications.  The symptoms are not associated with dyspepsia, dysphagia, unexplained weight loss, melena, rectal bleeding. She has no dyspnea,cough, hemoptysis, or syncope.  Stress Myoview was negative in 2012.She is not on a heart healthy diet; she does not exercise. Family history is negative for premature coronary disease   Her TSH is .005 History of thyroid cancer 2010 with surgery at [facility]  Apparantly they want her to be "supressed"   ROS: Denies fever, malais, weight loss, blurry vision, decreased visual acuity, cough, sputum, SOB, hemoptysis, pleuritic pain, palpitaitons, heartburn, abdominal pain, melena, lower extremity edema, claudication, or rash.  All other systems reviewed and negative   General: Affect appropriate Healthy:  appears stated age HEENT: normal Neck supple with no adenopathy JVP normal no bruits no thyromegaly Lungs clear with no wheezing and good diaphragmatic motion Heart:  S1/S2 no murmur,rub, gallop or click PMI normal Abdomen: benighn, BS positve, no tenderness, no AAA no bruit.  No HSM or HJR Distal pulses intact with no bruits No edema Neuro non-focal Skin warm and dry No muscular weakness  Medications Current Outpatient Prescriptions  Medication Sig Dispense Refill  . ARMOUR THYROID PO 2 by mouth in the am, 1 by mouth in the pm      . cevimeline (EVOXAC) 30 MG capsule Take 30 mg by mouth  3 (three) times daily.      . Cholecalciferol (VITAMIN D PO) Take 1 tablet by mouth daily.       . Lisdexamfetamine Dimesylate (VYVANSE PO) Take 40 mg by mouth daily.       . naproxen sodium (ANAPROX) 220 MG tablet Take 220 mg by mouth daily.        No current facility-administered medications for this visit.    Allergies Cefaclor and Oxycodone-acetaminophen  Family History: Family History  Problem Relation Age of Onset  . Hypertension Mother   . Hypertension Maternal Grandmother   . CAD Maternal Grandmother     CBAG in 41s; pacer  . Cancer Neg Hx   . Diabetes Neg Hx   . Stroke Neg Hx   . Hyperlipidemia Father     Social History: History   Social History  . Marital Status: Legally Separated    Spouse Name: N/A    Number of Children: N/A  . Years of Education: N/A   Occupational History  . Not on file.   Social History Main Topics  . Smoking status: Never Smoker   . Smokeless tobacco: Never Used  . Alcohol Use: Yes     Comment: SOCIALLY ONLY  . Drug Use: No  . Sexual Activity: No   Other Topics Concern  . Not on file   Social History Narrative  . No narrative on file    Electrocardiogram:  5/22 NSR rate 97 normal ECG  Assessment and Plan

## 2013-08-11 NOTE — Assessment & Plan Note (Signed)
With atypical pain and mild exertional dyspnea  Normal ECG and stress test in 2012  Would just get an echo to make sure EF normal  Does not need f/u stress testing In future can be done with simple treadmill not nuclear study as resting ECG is normal

## 2013-08-12 ENCOUNTER — Ambulatory Visit: Payer: BC Managed Care – PPO

## 2013-08-18 ENCOUNTER — Ambulatory Visit (INDEPENDENT_AMBULATORY_CARE_PROVIDER_SITE_OTHER): Payer: BC Managed Care – PPO | Admitting: Women's Health

## 2013-08-18 ENCOUNTER — Other Ambulatory Visit (HOSPITAL_COMMUNITY)
Admission: RE | Admit: 2013-08-18 | Discharge: 2013-08-18 | Disposition: A | Discharge status: Home / Self Care | Payer: BC Managed Care – PPO | Source: Ambulatory Visit | Attending: Gynecology | Admitting: Gynecology

## 2013-08-18 ENCOUNTER — Encounter: Payer: Self-pay | Admitting: Women's Health

## 2013-08-18 VITALS — BP 120/72 | Ht 69.0 in | Wt 197.0 lb

## 2013-08-18 DIAGNOSIS — B009 Herpesviral infection, unspecified: Secondary | ICD-10-CM

## 2013-08-18 DIAGNOSIS — Z01419 Encounter for gynecological examination (general) (routine) without abnormal findings: Secondary | ICD-10-CM

## 2013-08-18 MED ORDER — VALACYCLOVIR HCL 500 MG PO TABS: ORAL_TABLET | ORAL | Status: DC

## 2013-08-18 NOTE — Progress Notes (Addendum)
Jennifer Shepard 1966-01-19 [id number]    History:    The patient presents for annual exam.  Monthly cycle/not sexually active. Thyroid Cancer 8/ 2010 thyroidectomy Dr Erroll Luna at [facility], every 6 months for 5 years. HSV with rare outbreaks. Normal Pap and mammogram history. Benign endometrial polyp 2004.  Past medical history, past surgical history, family history and social history were all reviewed and documented in the EPIC chart. 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  Appendectomy, C-sections 2005 and 2006. Mother hypertension. Nicole 9, Mason 7 both doing well.   ROS:  A  ROS was performed and pertinent positives and negatives are included in the history.  Exam:  Filed Vitals:   08/18/13 1015  BP: 120/72    General appearance:  Normal Head/Neck:  Normal, without cervical or supraclavicular adenopathy. Thyroid:  Symmetrical, normal in size, without palpable masses or nodularity. Respiratory  Effort:  Normal  Auscultation:  Clear without wheezing or rhonchi Cardiovascular  Auscultation:  Regular rate, without rubs, murmurs or gallops  Edema/varicosities:  Not grossly evident Abdominal  Soft,nontender, without masses, guarding or rebound.  Liver/spleen:  No organomegaly noted  Hernia:  None appreciated  Skin  Inspection:  Grossly normal  Palpation:  Grossly normal Neurologic/psychiatric  Orientation:  Normal with appropriate conversation.  Mood/affect:  Normal  Genitourinary    Breasts: Examined lying and sitting.     Right: Without masses, retractions, discharge or axillary adenopathy.     Left: Without masses, retractions, discharge or axillary adenopathy.   Inguinal/mons:  Normal without inguinal adenopathy  External genitalia:  Normal  BUS/Urethra/Skene's glands:  Normal  Bladder:  Normal  Vagina:  Normal  Cervix:  Normal  Uterus:   normal in size, shape and contour.  Midline and mobile  Adnexa/parametria:     Rt: Without masses or tenderness.   Lt: Without masses or  tenderness.  Anus and perineum: Normal  Digital rectal exam: Normal sphincter tone without palpated masses or tenderness  Assessment/Plan:  47 y.o. DWF G4P2 for annual exam with no complaints.  ADD on Vyvance- primary care labs and meds HSV rare outbreaks Thyroidectomy/thyroid cancer 07/2009 on Armour Synthroid  Plan: SBE's, continue annual mammogram, calcium rich diet, vitamin D 1000 daily encouraged. Reviewed importance of increasing regular exercise, decreasing calories for health. Pap, Pap normal 2012, new screening guidelines reviewed. Valtrex 500 twice daily for 3-5 days as needed prescription, proper use given and reviewed.  Harrington Challenger [facility], 10:39 AM 08/18/2013

## 2013-08-22 ENCOUNTER — Ambulatory Visit
Admission: RE | Admit: 2013-08-22 | Discharge: 2013-08-22 | Disposition: A | Discharge status: Home / Self Care | Payer: BC Managed Care – PPO | Source: Ambulatory Visit

## 2013-08-22 ENCOUNTER — Ambulatory Visit (HOSPITAL_COMMUNITY): Payer: BC Managed Care – PPO | Attending: Cardiovascular Disease | Admitting: Radiology

## 2013-08-22 DIAGNOSIS — I079 Rheumatic tricuspid valve disease, unspecified: Secondary | ICD-10-CM | POA: Insufficient documentation

## 2013-08-22 DIAGNOSIS — R0609 Other forms of dyspnea: Secondary | ICD-10-CM | POA: Insufficient documentation

## 2013-08-22 DIAGNOSIS — R002 Palpitations: Secondary | ICD-10-CM | POA: Insufficient documentation

## 2013-08-22 DIAGNOSIS — R0989 Other specified symptoms and signs involving the circulatory and respiratory systems: Secondary | ICD-10-CM

## 2013-08-22 DIAGNOSIS — R0600 Dyspnea, unspecified: Secondary | ICD-10-CM

## 2013-08-22 DIAGNOSIS — Z1231 Encounter for screening mammogram for malignant neoplasm of breast: Secondary | ICD-10-CM

## 2013-08-22 NOTE — Progress Notes (Signed)
Echocardiogram performed.  

## 2013-11-06 ENCOUNTER — Other Ambulatory Visit: Payer: Self-pay

## 2013-11-06 DIAGNOSIS — B009 Herpesviral infection, unspecified: Secondary | ICD-10-CM

## 2013-11-06 MED ORDER — VALACYCLOVIR HCL 500 MG PO TABS: ORAL_TABLET | ORAL | Status: DC

## 2014-03-13 DIAGNOSIS — Z85850 Personal history of malignant neoplasm of thyroid: Secondary | ICD-10-CM | POA: Insufficient documentation

## 2014-05-20 ENCOUNTER — Encounter: Payer: Self-pay | Admitting: Internal Medicine

## 2014-05-20 ENCOUNTER — Ambulatory Visit (INDEPENDENT_AMBULATORY_CARE_PROVIDER_SITE_OTHER): Payer: BC Managed Care – PPO | Admitting: Internal Medicine

## 2014-05-20 VITALS — BP 152/100 | HR 112 | Temp 98.6°F | Ht 69.0 in | Wt 190.0 lb

## 2014-05-20 DIAGNOSIS — L259 Unspecified contact dermatitis, unspecified cause: Secondary | ICD-10-CM | POA: Insufficient documentation

## 2014-05-20 DIAGNOSIS — R030 Elevated blood-pressure reading, without diagnosis of hypertension: Secondary | ICD-10-CM

## 2014-05-20 MED ORDER — TRIAMCINOLONE ACETONIDE 0.1 % EX CREA: 1.0000 "application " | TOPICAL_CREAM | Freq: Two times a day (BID) | CUTANEOUS | Status: DC

## 2014-05-20 MED ORDER — PREDNISONE 10 MG PO TABS: ORAL_TABLET | ORAL | Status: DC

## 2014-05-20 MED ORDER — METHYLPREDNISOLONE ACETATE 80 MG/ML IJ SUSP
80.0000 mg | Freq: Once | INTRAMUSCULAR | Status: AC
  Administered 2014-05-20: 80 mg via INTRAMUSCULAR

## 2014-05-20 NOTE — Patient Instructions (Signed)
You had the steroid shot today  Please take all new medication as prescribed - the prednisone, and the cream for the worst areas  Please continue all other medications as before, and refills have been done if requested. Please have the pharmacy call with any other refills you may need.

## 2014-05-20 NOTE — Progress Notes (Signed)
   Subjective:    Patient ID: Jennifer Shepard, female    DOB: 09-26-1966, 48 y.o.   MRN: [id number]  HPI  Here with numerous linear itchy lesions to the arms and legs after working in the yard, no fever.  Has similar hx in years past. Pt denies chest pain, increased sob or doe, wheezing, orthopnea, PND, increased LE swelling, palpitations, dizziness or syncope. Past Medical History  Diagnosis Date  . Migraine   . HSV (herpes simplex virus) infection     PMH of  . Condyloma acuminatum     PMH of   Past Surgical History  Procedure Laterality Date  . Appendectomy    . Lasik    . Cesarean section  01/2004  . Cesarean section  10/2005  . Thyroidectomy  07/2009    FOR THYROID CANCER; Dr Vicie Mutters    reports that she has never smoked. She has never used smokeless tobacco. She reports that she drinks alcohol. She reports that she does not use illicit drugs. family history includes CAD in her maternal grandmother; Hyperlipidemia in her father; Hypertension in her maternal grandmother and mother. There is no history of Cancer, Diabetes, or Stroke. Allergies  Allergen Reactions  . Cefaclor     Angioedema of lips  . Oxycodone-Acetaminophen     itching   Current Outpatient Prescriptions on File Prior to Visit  Medication Sig Dispense Refill  . ARMOUR THYROID PO 2 by mouth in the am, 1 by mouth in the pm      . cevimeline (EVOXAC) 30 MG capsule Take 30 mg by mouth 3 (three) times daily.      . Cholecalciferol (VITAMIN D PO) Take 1 tablet by mouth daily.       . Lisdexamfetamine Dimesylate (VYVANSE PO) Take 40 mg by mouth daily.       . naproxen sodium (ANAPROX) 220 MG tablet Take 220 mg by mouth daily.       . valACYclovir (VALTREX) 500 MG tablet Take twice daily for 3-5 days as needed  30 tablet  12   No current facility-administered medications on file prior to visit.   Review of Systems All otherwise neg per pt     Objective:   Physical Exam BP 152/100  Pulse 112  Temp(Src) 98.6 F  (37 C) (Oral)  Ht 5\' 9"  (1.753 m)  Wt 190 lb (86.183 kg)  BMI 28.05 kg/m2  SpO2 99% VS noted,  Constitutional: Pt appears well-developed, well-nourished.  HENT: Head: NCAT.  Right Ear: External ear normal.  Left Ear: External ear normal.  Eyes: . Pupils are equal, round, and reactive to light. Conjunctivae and EOM are normal Neck: Normal range of motion. Neck supple.  Cardiovascular: Normal rate and regular rhythm.   Pulmonary/Chest: Effort normal and breath sounds normal.  Neurological: Pt is alert. Not confused , motor grossly intact Skin: with numerous linear weepy lesions to extremities Psychiatric: Pt behavior is normal. No agitation.     Assessment & Plan:

## 2014-05-20 NOTE — Assessment & Plan Note (Signed)
Mild to mod, for depomedrol, predpac asd, triam cr prn,  to f/u any worsening symptoms or concerns

## 2014-05-20 NOTE — Assessment & Plan Note (Signed)
Noted today, likely situational, for cont'd f/u at home and next visit BP Readings from Last 3 Encounters:  05/20/14 152/100  08/18/13 120/72  08/11/13 150/84

## 2014-05-20 NOTE — Progress Notes (Signed)
Pre visit review using our clinic review tool, if applicable. No additional management support is needed unless otherwise documented below in the visit note. 

## 2014-09-08 ENCOUNTER — Other Ambulatory Visit: Payer: Self-pay | Admitting: Women's Health

## 2014-10-26 ENCOUNTER — Encounter: Payer: Self-pay | Admitting: Internal Medicine

## 2014-11-26 ENCOUNTER — Ambulatory Visit: Payer: BC Managed Care – PPO | Admitting: Internal Medicine

## 2014-11-27 ENCOUNTER — Ambulatory Visit: Payer: BC Managed Care – PPO | Admitting: Family

## 2015-04-08 ENCOUNTER — Other Ambulatory Visit: Payer: Self-pay

## 2015-04-08 DIAGNOSIS — Z1231 Encounter for screening mammogram for malignant neoplasm of breast: Secondary | ICD-10-CM

## 2015-04-28 ENCOUNTER — Ambulatory Visit: Payer: Self-pay

## 2015-06-03 ENCOUNTER — Telehealth: Payer: Self-pay | Admitting: *Deleted

## 2015-06-03 NOTE — Telephone Encounter (Signed)
Pt called requesting birth control to help delay cycle, cycle due to start on 06/19/15. I explained to pt that she would need to start birth control pills months before in order for this to take place. Pt will call back to schedule annual.

## 2015-06-26 HISTORY — PX: INTRAUTERINE DEVICE INSERTION: SHX323

## 2015-07-12 ENCOUNTER — Encounter: Payer: Self-pay | Admitting: Women's Health

## 2015-07-12 ENCOUNTER — Ambulatory Visit (INDEPENDENT_AMBULATORY_CARE_PROVIDER_SITE_OTHER): Payer: BLUE CROSS/BLUE SHIELD | Admitting: Family Medicine

## 2015-07-12 ENCOUNTER — Ambulatory Visit (INDEPENDENT_AMBULATORY_CARE_PROVIDER_SITE_OTHER): Payer: BLUE CROSS/BLUE SHIELD | Admitting: Women's Health

## 2015-07-12 ENCOUNTER — Encounter: Payer: Self-pay | Admitting: Family Medicine

## 2015-07-12 VITALS — BP 134/90 | HR 90 | Temp 98.6°F | Wt 184.0 lb

## 2015-07-12 VITALS — BP 170/90

## 2015-07-12 DIAGNOSIS — Z113 Encounter for screening for infections with a predominantly sexual mode of transmission: Secondary | ICD-10-CM

## 2015-07-12 DIAGNOSIS — S00511A Abrasion of lip, initial encounter: Secondary | ICD-10-CM | POA: Diagnosis not present

## 2015-07-12 NOTE — Progress Notes (Signed)
Pre visit review using our clinic review tool, if applicable. No additional management support is needed unless otherwise documented below in the visit note. 

## 2015-07-12 NOTE — Progress Notes (Signed)
   Subjective:    Patient ID: Jennifer Shepard, female    DOB: 1966-05-08, 49 y.o.   MRN: [id number]  HPI Acute visit for lower lip injury which occurred last night. She was playing with a puppy and the puppy's head accidentally bumped against her lower lip. She had some immediate bleeding and small laceration. Tetanus was in April of this year.  She is not aware that she had any kind of tooth puncture into the lip. She has not had any fevers or chills. She applied some ice. Minimal swelling today.  Past Medical History  Diagnosis Date  . Migraine   . HSV (herpes simplex virus) infection     PMH of  . Condyloma acuminatum     PMH of   Past Surgical History  Procedure Laterality Date  . Appendectomy    . Lasik    . Cesarean section  01/2004  . Cesarean section  10/2005  . Thyroidectomy  07/2009    FOR THYROID CANCER; Dr Vicie Mutters    reports that she has never smoked. She has never used smokeless tobacco. She reports that she drinks alcohol. She reports that she does not use illicit drugs. family history includes CAD in her maternal grandmother; Hyperlipidemia in her father; Hypertension in her maternal grandmother and mother. There is no history of Cancer, Diabetes, or Stroke. Allergies  Allergen Reactions  . Cefaclor     Angioedema of lips  . Oxycodone-Acetaminophen     itching      Review of Systems  Constitutional: Negative for fever and chills.       Objective:   Physical Exam  Constitutional: She appears well-developed and well-nourished.  HENT:  mid lower lip reveals somewhat abraded area externally. No surrounding cellulitis changes. Minimal swelling. She has small laceration on the inner aspect of her lip but no purulent drainage and no surrounding erythema  Cardiovascular: Normal rate and regular rhythm.           Assessment & Plan:  Laceration/abrasion lower lip. No signs of secondary infection this time. Saltwater rinses recommended. Tetanus up-to-date.  Follow-up promptly for signs of secondary infection

## 2015-07-12 NOTE — Patient Instructions (Signed)
Follow up for any fever, chills, or any progressive redness or drainage around wound site.

## 2015-07-12 NOTE — Progress Notes (Signed)
Patient ID: Jennifer Shepard, female   DOB: [birth year], 49 y.o.   MRN: [id number] Presents to discuss contraception. New partner/no contraception. Denies discharge, urinary symptoms, abdominal pain or fever. Regular monthly 6-7 day cycle. Had used a friend's OC last month to postpone cycle due to vacation. Reports normal blood pressure at primary care last month.  Exam: Appears well. Blood pressure 170/90 twice. External genitalia within normal limits, speculum exam no visible discharge or erythema, GC/Chlamydia culture taken. Bimanual no CMT or tenderness with exam.  Contraception management Elevated blood pressure  Plan: Contraception options reviewed, Reviewed importance of no further OCs, risks of blood clots, strokes with elevated blood pressure. Will check blood pressure at home and follow-up with primary care if continues elevated greater than 130/80. Aware of hazards of hypertension. Contraception options reviewed, Mirena IUD information given reviewed slight risk for infection, perforation or hemorrhage, Dr. Phineas Real to place with next cycle, will check coverage and return to office for placement. Also keep scheduled annual exam will check HIV, hepatitis and RPR.

## 2015-07-12 NOTE — Patient Instructions (Signed)
Levonorgestrel intrauterine device (IUD) What is this medicine? LEVONORGESTREL IUD (LEE voe nor jes trel) is a contraceptive (birth control) device. The device is placed inside the uterus by a healthcare professional. It is used to prevent pregnancy and can also be used to treat heavy bleeding that occurs during your period. Depending on the device, it can be used for 3 to 5 years. This medicine may be used for other purposes; ask your health care provider or pharmacist if you have questions. COMMON BRAND NAME(S): LILETTA, Mirena, Skyla What should I tell my health care provider before I take this medicine? They need to know if you have any of these conditions: -abnormal Pap smear -cancer of the breast, uterus, or cervix -diabetes -endometritis -genital or pelvic infection now or in the past -have more than one sexual partner or your partner has more than one partner -heart disease -history of an ectopic or tubal pregnancy -immune system problems -IUD in place -liver disease or tumor -problems with blood clots or take blood-thinners -use intravenous drugs -uterus of unusual shape -vaginal bleeding that has not been explained -an unusual or allergic reaction to levonorgestrel, other hormones, silicone, or polyethylene, medicines, foods, dyes, or preservatives -pregnant or trying to get pregnant -breast-feeding How should I use this medicine? This device is placed inside the uterus by a health care professional. Talk to your pediatrician regarding the use of this medicine in children. Special care may be needed. Overdosage: If you think you have taken too much of this medicine contact a poison control center or emergency room at once. NOTE: This medicine is only for you. Do not share this medicine with others. What if I miss a dose? This does not apply. What may interact with this medicine? Do not take this medicine with any of the following  medications: -amprenavir -bosentan -fosamprenavir This medicine may also interact with the following medications: -aprepitant -barbiturate medicines for inducing sleep or treating seizures -bexarotene -griseofulvin -medicines to treat seizures like carbamazepine, ethotoin, felbamate, oxcarbazepine, phenytoin, topiramate -modafinil -pioglitazone -rifabutin -rifampin -rifapentine -some medicines to treat HIV infection like atazanavir, indinavir, lopinavir, nelfinavir, tipranavir, ritonavir -St. John's wort -warfarin This list may not describe all possible interactions. Give your health care provider a list of all the medicines, herbs, non-prescription drugs, or dietary supplements you use. Also tell them if you smoke, drink alcohol, or use illegal drugs. Some items may interact with your medicine. What should I watch for while using this medicine? Visit your doctor or health care professional for regular check ups. See your doctor if you or your partner has sexual contact with others, becomes HIV positive, or gets a sexual transmitted disease. This product does not protect you against HIV infection (AIDS) or other sexually transmitted diseases. You can check the placement of the IUD yourself by reaching up to the top of your vagina with clean fingers to feel the threads. Do not pull on the threads. It is a good habit to check placement after each menstrual period. Call your doctor right away if you feel more of the IUD than just the threads or if you cannot feel the threads at all. The IUD may come out by itself. You may become pregnant if the device comes out. If you notice that the IUD has come out use a backup birth control method like condoms and call your health care provider. Using tampons will not change the position of the IUD and are okay to use during your period. What side effects may   I notice from receiving this medicine? Side effects that you should report to your doctor or  health care professional as soon as possible: -allergic reactions like skin rash, itching or hives, swelling of the face, lips, or tongue -fever, flu-like symptoms -genital sores -high blood pressure -no menstrual period for 6 weeks during use -pain, swelling, warmth in the leg -pelvic pain or tenderness -severe or sudden headache -signs of pregnancy -stomach cramping -sudden shortness of breath -trouble with balance, talking, or walking -unusual vaginal bleeding, discharge -yellowing of the eyes or skin Side effects that usually do not require medical attention (report to your doctor or health care professional if they continue or are bothersome): -acne -breast pain -change in sex drive or performance -changes in weight -cramping, dizziness, or faintness while the device is being inserted -headache -irregular menstrual bleeding within first 3 to 6 months of use -nausea This list may not describe all possible side effects. Call your doctor for medical advice about side effects. You may report side effects to FDA at 1-800-FDA-1088. Where should I keep my medicine? This does not apply. NOTE: This sheet is a summary. It may not cover all possible information. If you have questions about this medicine, talk to your doctor, pharmacist, or health care provider.  2015, Elsevier/Gold Standard. (2012-01-11 13:54:04)  

## 2015-07-13 ENCOUNTER — Telehealth: Payer: Self-pay | Admitting: Gynecology

## 2015-07-13 LAB — GC/CHLAMYDIA PROBE AMP
CT PROBE, AMP APTIMA: NEGATIVE
GC Probe RNA: NEGATIVE

## 2015-07-13 NOTE — Telephone Encounter (Signed)
07/13/15-Pt was advised today that her [facility] ins will cover the MIrena and insertion for contraception at 100%, no copay. Appt already made with TF for insertion.wl

## 2015-07-23 ENCOUNTER — Encounter: Payer: Self-pay | Admitting: Gynecology

## 2015-07-23 ENCOUNTER — Ambulatory Visit (INDEPENDENT_AMBULATORY_CARE_PROVIDER_SITE_OTHER): Payer: BLUE CROSS/BLUE SHIELD | Admitting: Gynecology

## 2015-07-23 VITALS — BP 136/84

## 2015-07-23 DIAGNOSIS — Z30430 Encounter for insertion of intrauterine contraceptive device: Secondary | ICD-10-CM | POA: Diagnosis not present

## 2015-07-23 MED ORDER — LEVONORGESTREL 20 MCG/24HR IU IUD: INTRAUTERINE_SYSTEM | Freq: Once | INTRAUTERINE | Status: DC

## 2015-07-23 NOTE — Progress Notes (Signed)
Patient presents for Mirena IUD placement. She has read through the booklet, has no contraindications and signed the consent form. She currently is on a normal menses.  I reviewed the insertional process with her as well as the risks to include infection, either immediate or long-term, uterine perforation or migration requiring surgery to remove, other complications such as pain, hormonal side effects, infertility and possibility of failure with subsequent pregnancy.   Exam with Kim assistant Pelvic: External BUS vagina normal. Cervix normal with moderate menses flow. Uterus retroverted normal size shape contour midline mobile nontender. Adnexa without masses or tenderness.  Procedure: The cervix was cleansed with Betadine, anterior lip grasped with a single-tooth tenaculum, the uterus was sounded and a Mirena IUD was placed according to manufacturer's recommendations without difficulty. The strings were trimmed. The patient tolerated well and will follow up in one month for a postinsertional check.  Lot number:  [id number]    Anastasio Auerbach MD, 2:40 PM 07/23/2015

## 2015-07-23 NOTE — Patient Instructions (Signed)
Intrauterine Device Insertion Most often, an intrauterine device (IUD) is inserted into the uterus to prevent pregnancy. There are 2 types of IUDs available:  Copper IUD--This type of IUD creates an environment that is not favorable to sperm survival. The mechanism of action of the copper IUD is not known for certain. It can stay in place for 10 years.  Hormone IUD--This type of IUD contains the hormone progestin (synthetic progesterone). The progestin thickens the cervical mucus and prevents sperm from entering the uterus, and it also thins the uterine lining. There is no evidence that the hormone IUD prevents implantation. One hormone IUD can stay in place for up to 5 years, and a different hormone IUD can stay in place for up to 3 years. An IUD is the most cost-effective birth control if left in place for the full duration. It may be removed at any time. LET YOUR HEALTH CARE PROVIDER KNOW ABOUT:  Any allergies you have.  All medicines you are taking, including vitamins, herbs, eye drops, creams, and over-the-counter medicines.  Previous problems you or members of your family have had with the use of anesthetics.  Any blood disorders you have.  Previous surgeries you have had.  Possibility of pregnancy.  Medical conditions you have. RISKS AND COMPLICATIONS  Generally, intrauterine device insertion is a safe procedure. However, as with any procedure, complications can occur. Possible complications include:  Accidental puncture (perforation) of the uterus.  Accidental placement of the IUD either in the muscle layer of the uterus (myometrium) or outside the uterus. If this happens, the IUD can be found essentially floating around the bowels and must be taken out surgically.  The IUD may fall out of the uterus (expulsion). This is more common in women who have recently had a child.   Pregnancy in the fallopian tube (ectopic).  Pelvic inflammatory disease (PID), which is infection of  the uterus and fallopian tubes. The risk of PID is slightly increased in the first 20 days after the IUD is placed, but the overall risk is still very low. BEFORE THE PROCEDURE  Schedule the IUD insertion for when you will have your menstrual period or right after, to make sure you are not pregnant. Placement of the IUD is better tolerated shortly after a menstrual cycle.  You may need to take tests or be examined to make sure you are not pregnant.  You may be required to take a pregnancy test.  You may be required to get checked for sexually transmitted infections (STIs) prior to placement. Placing an IUD in someone who has an infection can make the infection worse.  You may be given a pain reliever to take 1 or 2 hours before the procedure.  An exam will be performed to determine the size and position of your uterus.  Ask your health care provider about changing or stopping your regular medicines. PROCEDURE   A tool (speculum) is placed in the vagina. This allows your health care provider to see the lower part of the uterus (cervix).  The cervix is prepped with a medicine that lowers the risk of infection.  You may be given a medicine to numb each side of the cervix (intracervical or paracervical block). This is used to block and control any discomfort with insertion.  A tool (uterine sound) is inserted into the uterus to determine the length of the uterine cavity and the direction the uterus may be tilted.  A slim instrument (IUD inserter) is inserted through the cervical   canal and into your uterus.  The IUD is placed in the uterine cavity and the insertion device is removed.  The nylon string that is attached to the IUD and used for eventual IUD removal is trimmed. It is trimmed so that it lays high in the vagina, just outside the cervix. AFTER THE PROCEDURE  You may have bleeding after the procedure. This is normal. It varies from light spotting for a few days to menstrual-like  bleeding.  You may have mild cramping. Document Released: 08/09/2011 Document Revised: 10/01/2013 Document Reviewed: 06/01/2013 ExitCare Patient Information 2015 [employer]. This information is not intended to replace advice given to you by your health care provider. Make sure you discuss any questions you have with your health care provider.  

## 2015-08-03 ENCOUNTER — Ambulatory Visit (INDEPENDENT_AMBULATORY_CARE_PROVIDER_SITE_OTHER): Payer: BLUE CROSS/BLUE SHIELD | Admitting: Women's Health

## 2015-08-03 ENCOUNTER — Other Ambulatory Visit (HOSPITAL_COMMUNITY)
Admission: RE | Admit: 2015-08-03 | Discharge: 2015-08-03 | Disposition: A | Discharge status: Home / Self Care | Payer: BLUE CROSS/BLUE SHIELD | Source: Ambulatory Visit | Attending: Women's Health | Admitting: Women's Health

## 2015-08-03 ENCOUNTER — Encounter: Payer: Self-pay | Admitting: Women's Health

## 2015-08-03 VITALS — BP 140/70 | Ht 69.0 in | Wt 184.0 lb

## 2015-08-03 DIAGNOSIS — B009 Herpesviral infection, unspecified: Secondary | ICD-10-CM

## 2015-08-03 DIAGNOSIS — Z01419 Encounter for gynecological examination (general) (routine) without abnormal findings: Secondary | ICD-10-CM | POA: Insufficient documentation

## 2015-08-03 DIAGNOSIS — Z975 Presence of (intrauterine) contraceptive device: Secondary | ICD-10-CM

## 2015-08-03 DIAGNOSIS — Z113 Encounter for screening for infections with a predominantly sexual mode of transmission: Secondary | ICD-10-CM

## 2015-08-03 DIAGNOSIS — Z1151 Encounter for screening for human papillomavirus (HPV): Secondary | ICD-10-CM | POA: Insufficient documentation

## 2015-08-03 LAB — CBC WITH DIFFERENTIAL/PLATELET
BASOS PCT: 1 % (ref 0–1)
Basophils Absolute: 0.1 10*3/uL (ref 0.0–0.1)
Eosinophils Absolute: 0 10*3/uL (ref 0.0–0.7)
Eosinophils Relative: 0 % (ref 0–5)
HEMATOCRIT: 32.3 % — AB (ref 36.0–46.0)
HEMOGLOBIN: 10 g/dL — AB (ref 12.0–15.0)
LYMPHS ABS: 1.4 10*3/uL (ref 0.7–4.0)
Lymphocytes Relative: 22 % (ref 12–46)
MCH: 25.5 pg — AB (ref 26.0–34.0)
MCHC: 31 g/dL (ref 30.0–36.0)
MCV: 82.4 fL (ref 78.0–100.0)
MONO ABS: 0.6 10*3/uL (ref 0.1–1.0)
MPV: 10.3 fL (ref 8.6–12.4)
Monocytes Relative: 9 % (ref 3–12)
Neutro Abs: 4.4 10*3/uL (ref 1.7–7.7)
Neutrophils Relative %: 68 % (ref 43–77)
Platelets: 329 10*3/uL (ref 150–400)
RBC: 3.92 MIL/uL (ref 3.87–5.11)
RDW: 17.1 % — AB (ref 11.5–15.5)
WBC: 6.4 10*3/uL (ref 4.0–10.5)

## 2015-08-03 MED ORDER — VALACYCLOVIR HCL 500 MG PO TABS: ORAL_TABLET | ORAL | Status: DC

## 2015-08-03 NOTE — Progress Notes (Signed)
SHANESHA BEDNARZ 18-Jan-1966 [id number]    History:    Presents for annual exam.  Mirena IUD placed 07/30/2015 per Dr. Phineas Real, stopped bleeding today. Normal Pap and mammogram history. 07/2009 thyroidectomy /tyroid cancer on Synthroid per endocrinologist. Occasional blood pressure elevations, checking blood pressure at home and states never greater than 130/80.  Past medical history, past surgical history, family history and social history were all reviewed and documented in the EPIC chart. Works in [employer] under[employer]. Elmyra Ricks 12, Mason 9 both doing okay with divorce. Mother hypertension.  ROS:  A ROS was performed and pertinent positives and negatives are included.  Exam:  Filed Vitals:   08/03/15 1525  BP: 140/70    General appearance:  Normal Thyroid:  Symmetrical, normal in size, without palpable masses or nodularity. Respiratory  Auscultation:  Clear without wheezing or rhonchi Cardiovascular  Auscultation:  Regular rate, without rubs, murmurs or gallops  Edema/varicosities:  Not grossly evident Abdominal  Soft,nontender, without masses, guarding or rebound.  Liver/spleen:  No organomegaly noted  Hernia:  None appreciated  Skin  Inspection:  Grossly normal   Breasts: Examined lying and sitting.     Right: Without masses, retractions, discharge or axillary adenopathy.     Left: Without masses, retractions, discharge or axillary adenopathy. Gentitourinary   Inguinal/mons:  Normal without inguinal adenopathy  External genitalia:  Normal  BUS/Urethra/Skene's glands:  Normal  Vagina:  Normal  Cervix:  Normal IUD strings visible  Uterus:  normal in size, shape and contour.  Midline and mobile  Adnexa/parametria:     Rt: Without masses or tenderness.   Lt: Without masses or tenderness.  Anus and perineum: Normal  Digital rectal exam: Normal sphincter tone without palpated masses or tenderness  Assessment/Plan:  49 y.o. D WF G2 P2 for annual exam with no complaints.  07/30/2015 Mirena  IUD Hypothyroid-endocrinologist manages labs and meds Borderline hypertension HSV-rare outbreaks  Plan: Continue monitoring blood pressure, if greater than 130/80 instructed to follow-up with primary care for possible medication. Reviewed hazards of untreated hypertension. SBE's, keep scheduled screening mammogram, reviewed importance of annual screen. Exercise, calcium rich diet, vitamin D 2000 daily. Valtrex 500 twice daily 3-5 days as needed. Prescription, proper use reviewed. CBC, vitamin D, lipid panel, CMP, UA, Pap with HR HPV typing, last pap normal 2014, new screening guidelines reviewed.    Huel [facility], 4:03 PM 08/03/2015

## 2015-08-03 NOTE — Patient Instructions (Signed)

## 2015-08-04 ENCOUNTER — Encounter: Payer: Self-pay | Admitting: Women's Health

## 2015-08-04 LAB — COMPREHENSIVE METABOLIC PANEL
ALBUMIN: 3.8 g/dL (ref 3.6–5.1)
ALK PHOS: 46 U/L (ref 33–115)
ALT: 11 U/L (ref 6–29)
AST: 15 U/L (ref 10–35)
BUN: 14 mg/dL (ref 7–25)
CO2: 24 mmol/L (ref 20–31)
CREATININE: 0.64 mg/dL (ref 0.50–1.10)
Calcium: 9.3 mg/dL (ref 8.6–10.2)
Chloride: 103 mmol/L (ref 98–110)
GLUCOSE: 91 mg/dL (ref 65–99)
POTASSIUM: 4.6 mmol/L (ref 3.5–5.3)
Sodium: 137 mmol/L (ref 135–146)
Total Bilirubin: 0.4 mg/dL (ref 0.2–1.2)
Total Protein: 6.6 g/dL (ref 6.1–8.1)

## 2015-08-04 LAB — LIPID PANEL
CHOL/HDL RATIO: 1.9 ratio (ref <=5.0)
CHOLESTEROL: 184 mg/dL (ref 125–200)
HDL: 99 mg/dL (ref >=46)
LDL Cholesterol: 73 mg/dL (ref <130)
Triglycerides: 58 mg/dL (ref <150)
VLDL: 12 mg/dL (ref <30)

## 2015-08-04 LAB — HEPATITIS B SURFACE ANTIGEN: Hepatitis B Surface Ag: NEGATIVE

## 2015-08-04 LAB — RPR

## 2015-08-04 LAB — HEPATITIS C ANTIBODY: HCV Ab: NEGATIVE

## 2015-08-04 LAB — HIV ANTIBODY (ROUTINE TESTING W REFLEX): HIV 1&2 Ab, 4th Generation: NONREACTIVE

## 2015-08-04 LAB — VITAMIN D 25 HYDROXY (VIT D DEFICIENCY, FRACTURES): Vit D, 25-Hydroxy: 62 ng/mL (ref 30–100)

## 2015-08-05 LAB — CYTOLOGY - PAP

## 2015-08-24 ENCOUNTER — Encounter: Payer: Self-pay | Admitting: Women's Health

## 2015-08-24 ENCOUNTER — Ambulatory Visit (INDEPENDENT_AMBULATORY_CARE_PROVIDER_SITE_OTHER): Payer: BLUE CROSS/BLUE SHIELD | Admitting: Women's Health

## 2015-08-24 DIAGNOSIS — B373 Candidiasis of vulva and vagina: Secondary | ICD-10-CM | POA: Diagnosis not present

## 2015-08-24 DIAGNOSIS — A499 Bacterial infection, unspecified: Secondary | ICD-10-CM | POA: Diagnosis not present

## 2015-08-24 DIAGNOSIS — B9689 Other specified bacterial agents as the cause of diseases classified elsewhere: Secondary | ICD-10-CM

## 2015-08-24 DIAGNOSIS — N760 Acute vaginitis: Secondary | ICD-10-CM

## 2015-08-24 DIAGNOSIS — B3731 Acute candidiasis of vulva and vagina: Secondary | ICD-10-CM

## 2015-08-24 LAB — WET PREP FOR TRICH, YEAST, CLUE: Trich, Wet Prep: NONE SEEN

## 2015-08-24 MED ORDER — METRONIDAZOLE 0.75 % VA GEL: VAGINAL | Status: DC

## 2015-08-24 MED ORDER — FLUCONAZOLE 150 MG PO TABS: 150.0000 mg | ORAL_TABLET | Freq: Once | ORAL | Status: DC

## 2015-08-24 NOTE — Progress Notes (Signed)
Patient ID: Jennifer Shepard, female   DOB: [birth year], 49 y.o.   MRN: [id number] Presents with complaint of vaginal irritation with itching. Used over-the-counter Monistat with some relief. Denies urinary symptoms, abdominal pain, odor or fever. Mirena IUD placed 07/23/2015 and has had light spotting since. Negative STD screen.  Exam: Appears well. External genitalia erythematous. Left labia 1 cm  Folliculitis. Speculum exam IUD strings visible in os, minimal blood, wet prep positive for yeast, amines, clues, many bacteria. Bimanual no CMT or tenderness.  Persistent spotting after Mirena IUD Yeast vaginitis Bacteria vaginosis  Plan: Will watch spotting at this time, cycle due. Instructed to call if spotting persists. Diflucan 150 by mouth 1 dose, MetroGel vaginal cream 1 applicator at bedtime 5, alcohol precautions reviewed. Instructed to call if no relief of irritation. Loose clothing, antibiotic cream to folliculitis, open to air as able.

## 2015-08-24 NOTE — Addendum Note (Signed)
Addended by: Thamas Jaegers on: 08/24/2015 02:07 PM   Modules accepted: Orders

## 2015-08-24 NOTE — Patient Instructions (Signed)

## 2015-09-03 ENCOUNTER — Telehealth: Payer: Self-pay | Admitting: *Deleted

## 2015-09-03 NOTE — Telephone Encounter (Signed)
Pt called c/o spotting with Mirena IUD placed on 07/23/15 mainly seen when wiping after using bathroom, pt said no pain, wearing light panty liner. Pt will continue to watch for now and follow up if needed, informed not abnormal to have spotting with IUD.

## 2015-09-08 ENCOUNTER — Encounter: Payer: Self-pay | Admitting: Women's Health

## 2015-09-08 ENCOUNTER — Ambulatory Visit (INDEPENDENT_AMBULATORY_CARE_PROVIDER_SITE_OTHER): Payer: BLUE CROSS/BLUE SHIELD | Admitting: Women's Health

## 2015-09-08 VITALS — BP 172/80 | Ht 69.0 in | Wt 184.0 lb

## 2015-09-08 DIAGNOSIS — N898 Other specified noninflammatory disorders of vagina: Secondary | ICD-10-CM | POA: Diagnosis not present

## 2015-09-08 DIAGNOSIS — L298 Other pruritus: Secondary | ICD-10-CM

## 2015-09-08 LAB — URINALYSIS W MICROSCOPIC + REFLEX CULTURE
BILIRUBIN URINE: NEGATIVE
Bacteria, UA: NONE SEEN [HPF]
Casts: NONE SEEN [LPF]
Crystals: NONE SEEN [HPF]
GLUCOSE, UA: NEGATIVE
Hgb urine dipstick: NEGATIVE
Ketones, ur: NEGATIVE
LEUKOCYTES UA: NEGATIVE
NITRITE: NEGATIVE
Protein, ur: NEGATIVE
RBC / HPF: NONE SEEN RBC/HPF (ref <=2)
SPECIFIC GRAVITY, URINE: 1.01 (ref 1.001–1.035)
WBC UA: NONE SEEN WBC/HPF (ref <=5)
Yeast: NONE SEEN [HPF]
pH: 7.5 (ref 5.0–8.0)

## 2015-09-08 LAB — WET PREP FOR TRICH, YEAST, CLUE
CLUE CELLS WET PREP: NONE SEEN
Trich, Wet Prep: NONE SEEN
YEAST WET PREP: NONE SEEN

## 2015-09-08 NOTE — Progress Notes (Signed)
Patient ID: Jennifer Shepard, female   DOB: April 06, 1966, 49 y.o.   MRN: [id number] Presents for follow-up. States had intercourse the other day and had mild vaginal discomfort and questions if BV/yeast is resolved. Negative STD screen with partner. Mirena IUD placed 06/2015 and has had spotting since that is now lessening. Mild urinary frequency without pain or burning, denies abdominal pain, or fever. Mild blood pressure elevations reports home monitoring at 150s/ 80s.  Exam: Appears well. Blood pressure 172/80. External genitalia within normal limits, speculum exam scant menses, vaginal walls pink without lesions, discharge or odor, wet prep negative. IUD strings visible. UA negative  Minimal spotting Mirena IUD Resolved BV and yeast Elevated blood pressure  Plan: Reviewed importance of follow-up with primary care for probable blood pressure medication. Instructed to take current readings for evaluation. Reassurance given regarding normality of exam and lab results. Instructed to call if continued problems.

## 2015-09-08 NOTE — Patient Instructions (Signed)
DASH Eating Plan °DASH stands for "Dietary Approaches to Stop Hypertension." The DASH eating plan is a healthy eating plan that has been shown to reduce high blood pressure (hypertension). Additional health benefits may include reducing the risk of type 2 diabetes mellitus, heart disease, and stroke. The DASH eating plan may also help with weight loss. °WHAT DO I NEED TO KNOW ABOUT THE DASH EATING PLAN? °For the DASH eating plan, you will follow these general guidelines: °· Choose foods with a percent daily value for sodium of less than 5% (as listed on the food label). °· Use salt-free seasonings or herbs instead of table salt or sea salt. °· Check with your health care provider or pharmacist before using salt substitutes. °· Eat lower-sodium products, often labeled as "lower sodium" or "no salt added." °· Eat fresh foods. °· Eat more vegetables, fruits, and low-fat dairy products. °· Choose whole grains. Look for the word "whole" as the first word in the ingredient list. °· Choose fish and skinless chicken or turkey more often than red meat. Limit fish, poultry, and meat to 6 oz (170 g) each day. °· Limit sweets, desserts, sugars, and sugary drinks. °· Choose heart-healthy fats. °· Limit cheese to 1 oz (28 g) per day. °· Eat more home-cooked food and less restaurant, buffet, and fast food. °· Limit fried foods. °· Cook foods using methods other than frying. °· Limit canned vegetables. If you do use them, rinse them well to decrease the sodium. °· When eating at a restaurant, ask that your food be prepared with less salt, or no salt if possible. °WHAT FOODS CAN I EAT? °Seek help from a dietitian for individual calorie needs. °Grains °Whole grain or whole wheat bread. Brown rice. Whole grain or whole wheat pasta. Quinoa, bulgur, and whole grain cereals. Low-sodium cereals. Corn or whole wheat flour tortillas. Whole grain cornbread. Whole grain crackers. Low-sodium crackers. °Vegetables °Fresh or frozen vegetables  (raw, steamed, roasted, or grilled). Low-sodium or reduced-sodium tomato and vegetable juices. Low-sodium or reduced-sodium tomato sauce and paste. Low-sodium or reduced-sodium canned vegetables.  °Fruits °All fresh, canned (in natural juice), or frozen fruits. °Meat and Other Protein Products °Ground beef (85% or leaner), grass-fed beef, or beef trimmed of fat. Skinless chicken or turkey. Ground chicken or turkey. Pork trimmed of fat. All fish and seafood. Eggs. Dried beans, peas, or lentils. Unsalted nuts and seeds. Unsalted canned beans. °Dairy °Low-fat dairy products, such as skim or 1% milk, 2% or reduced-fat cheeses, low-fat ricotta or cottage cheese, or plain low-fat yogurt. Low-sodium or reduced-sodium cheeses. °Fats and Oils °Tub margarines without trans fats. Light or reduced-fat mayonnaise and salad dressings (reduced sodium). Avocado. Safflower, olive, or canola oils. Natural peanut or almond butter. °Other °Unsalted popcorn and pretzels. °The items listed above may not be a complete list of recommended foods or beverages. Contact your dietitian for more options. °WHAT FOODS ARE NOT RECOMMENDED? °Grains °White bread. White pasta. White rice. Refined cornbread. Bagels and croissants. Crackers that contain trans fat. °Vegetables °Creamed or fried vegetables. Vegetables in a cheese sauce. Regular canned vegetables. Regular canned tomato sauce and paste. Regular tomato and vegetable juices. °Fruits °Dried fruits. Canned fruit in light or heavy syrup. Fruit juice. °Meat and Other Protein Products °Fatty cuts of meat. Ribs, chicken wings, bacon, sausage, bologna, salami, chitterlings, fatback, hot dogs, bratwurst, and packaged luncheon meats. Salted nuts and seeds. Canned beans with salt. °Dairy °Whole or 2% milk, cream, half-and-half, and cream cheese. Whole-fat or sweetened yogurt. Full-fat   cheeses or blue cheese. Nondairy creamers and whipped toppings. Processed cheese, cheese spreads, or cheese  curds. °Condiments °Onion and garlic salt, seasoned salt, table salt, and sea salt. Canned and packaged gravies. Worcestershire sauce. Tartar sauce. Barbecue sauce. Teriyaki sauce. Soy sauce, including reduced sodium. Steak sauce. Fish sauce. Oyster sauce. Cocktail sauce. Horseradish. Ketchup and mustard. Meat flavorings and tenderizers. Bouillon cubes. Hot sauce. Tabasco sauce. Marinades. Taco seasonings. Relishes. °Fats and Oils °Butter, stick margarine, lard, shortening, ghee, and bacon fat. Coconut, palm kernel, or palm oils. Regular salad dressings. °Other °Pickles and olives. Salted popcorn and pretzels. °The items listed above may not be a complete list of foods and beverages to avoid. Contact your dietitian for more information. °WHERE CAN I FIND MORE INFORMATION? °National Heart, Lung, and Blood Institute: www.nhlbi.nih.gov/health/health-topics/topics/dash/ °Document Released: 11/30/2011 Document Revised: 04/27/2014 Document Reviewed: 10/15/2013 °ExitCare® Patient Information ©2015 [employer], [employer]. This information is not intended to replace advice given to you by your health care provider. Make sure you discuss any questions you have with your health care provider. ° °

## 2015-10-05 ENCOUNTER — Ambulatory Visit: Payer: BLUE CROSS/BLUE SHIELD | Admitting: Family

## 2015-10-05 ENCOUNTER — Ambulatory Visit (INDEPENDENT_AMBULATORY_CARE_PROVIDER_SITE_OTHER): Payer: BLUE CROSS/BLUE SHIELD | Admitting: Internal Medicine

## 2015-10-05 ENCOUNTER — Encounter: Payer: Self-pay | Admitting: Internal Medicine

## 2015-10-05 VITALS — BP 144/88 | HR 79 | Temp 97.9°F | Ht 69.0 in | Wt 191.5 lb

## 2015-10-05 DIAGNOSIS — M5481 Occipital neuralgia: Secondary | ICD-10-CM

## 2015-10-05 MED ORDER — GABAPENTIN 100 MG PO CAPS: ORAL_CAPSULE | ORAL | Status: DC

## 2015-10-05 NOTE — Progress Notes (Signed)
Pre visit review using our clinic review tool, if applicable. No additional management support is needed unless otherwise documented below in the visit note. 

## 2015-10-05 NOTE — Progress Notes (Signed)
   Subjective:    Patient ID: Jennifer Shepard, female    DOB: Apr 16, 1966, 49 y.o.   MRN: [id number]  HPI She describes a diffuse headache up to level X on a scale of 10 beginning 9/15. Initially it was 10 on a 10 scale and then became a dull ache for 5-8 days, finally resolving 9/27.  As of 10/7 she has pain in the right mid lower neck which radiates over the skull on the right varying from 4-10 in severity. She wanted to be evaluated before she saw her Chiropractor or got a massage.  She has no associated vision or neurologic symptoms except for intermittent numbness in the right index DIP area for one week.   Review of Systems Fever, chills, sweats, or unexplained weight loss not present. Mental status change or memory loss denied. Blurred vision , diplopia or vision loss absent. Vertigo, near syncope or imbalance denied. There is no weakness in extremities.   No loss of control of bladder or bowels. Radicular type pain absent. No seizure stigmata.    Objective:   Physical Exam Pertinent or positive findings include: Neuro: Cranial nerve exam revealed no deficits;sensation to light touch was normal ; deep tendon reflexes were normal; gait (heel/toe) normal; balance normal ; Romberg/ finger to nose  testing normal.Strength , tone, and deep tendon reflexes normal.  General appearance :adequately nourished; in no distress.  Eyes: No conjunctival inflammation or scleral icterus is present. Extraocular motion and field of vision is normal.  Oral exam:  Lips and gums are healthy appearing.There is no oropharyngeal erythema or exudate noted. Dental hygiene is good.  Heart:  Normal rate and regular rhythm. S1 and S2 normal without gallop, murmur, click, rub or other extra sounds    Lungs:Chest clear to auscultation; no wheezes, rhonchi,rales ,or rubs present.No increased work of breathing.   Vascular : Carotid and radial artery pulses are equal ; no bruits present.  Skin:Warm & dry.   Intact without suspicious lesions or rashes ; no tenting or jaundice   Lymphatic: No lymphadenopathy is noted about the head, neck, axilla.       Assessment & Plan:  #1 right occipital neuralgia  See orders and after visit summary

## 2015-10-05 NOTE — Patient Instructions (Addendum)
Use a cervical memory foam pillow to prevent hyperextension or hyperflexion of the cervical spine. Assess response to the gabapentin one every 8 hours as needed. Use an anti-inflammatory cream such as Aspercreme or Zostrix cream twice a day to the affected area as needed. In lieu of this warm moist compresses or  hot water bottle can be used. Do not apply ice .

## 2015-12-09 ENCOUNTER — Telehealth: Payer: Self-pay

## 2015-12-09 NOTE — Telephone Encounter (Signed)
Left Voice Mail for pt to call back.   RE: Flu Vaccine for 2016  

## 2016-03-13 ENCOUNTER — Ambulatory Visit (INDEPENDENT_AMBULATORY_CARE_PROVIDER_SITE_OTHER): Payer: BLUE CROSS/BLUE SHIELD | Admitting: Gynecology

## 2016-03-13 ENCOUNTER — Encounter: Payer: Self-pay | Admitting: Gynecology

## 2016-03-13 VITALS — BP 130/86

## 2016-03-13 DIAGNOSIS — N898 Other specified noninflammatory disorders of vagina: Secondary | ICD-10-CM | POA: Diagnosis not present

## 2016-03-13 LAB — WET PREP FOR TRICH, YEAST, CLUE
Clue Cells Wet Prep HPF POC: NONE SEEN
TRICH WET PREP: NONE SEEN
Yeast Wet Prep HPF POC: NONE SEEN

## 2016-03-13 MED ORDER — METRONIDAZOLE 0.75 % VA GEL: 1.0000 | Freq: Two times a day (BID) | VAGINAL | Status: DC

## 2016-03-13 NOTE — Patient Instructions (Signed)
Use the MetroGel vaginal application nightly for 5 nights. Follow up if symptoms persist, worsen or recur

## 2016-03-13 NOTE — Progress Notes (Signed)
    Jennifer Shepard July 18, 1966 [id number]        49 y.o.  [name] presents complaining of vaginal discharge and slight irritation since last week. Used a little leftover MetroGel last week and noticed her symptoms seem to get better but now they've recurred. No odor or significant itching.  No urinary symptoms such as frequency dysuria urgency or low back pain. No fever or chills.  Past medical history,surgical history, problem list, medications, allergies, family history and social history were all reviewed and documented in the EPIC chart.  Directed ROS with pertinent positives and negatives documented in the history of present illness/assessment and plan.  Exam: [name] assistant Filed Vitals:   03/13/16 1153  BP: 130/86   General appearance:  Normal Abdomen soft nontender without masses guarding rebound Pelvic external BUS vagina with white to yellowish discharge. Cervix normal IUD string visualized. Uterus normal size midline mobile nontender. Adnexa without masses or tenderness.  Assessment/Plan:  50 y.o. [id number] with history and exam consistent with bacterial vaginosis. Wet prep is unremarkable. We'll cover with MetroGel nightly times 5 nights. Extra kit refill provided as she is going on a cruise and wanted to have this in case it recurs. Follow up if symptoms persist, worsen or recur.    Anastasio Auerbach MD, 12:31 PM 03/13/2016

## 2016-03-15 ENCOUNTER — Emergency Department (HOSPITAL_COMMUNITY)
Admission: EM | Admit: 2016-03-15 | Discharge: 2016-03-15 | Disposition: A | Discharge status: Home / Self Care | Payer: BLUE CROSS/BLUE SHIELD | Attending: Emergency Medicine | Admitting: Emergency Medicine

## 2016-03-15 ENCOUNTER — Emergency Department (HOSPITAL_COMMUNITY): Payer: BLUE CROSS/BLUE SHIELD

## 2016-03-15 ENCOUNTER — Encounter (HOSPITAL_COMMUNITY): Payer: Self-pay | Admitting: *Deleted

## 2016-03-15 DIAGNOSIS — Z791 Long term (current) use of non-steroidal anti-inflammatories (NSAID): Secondary | ICD-10-CM | POA: Diagnosis not present

## 2016-03-15 DIAGNOSIS — Y998 Other external cause status: Secondary | ICD-10-CM | POA: Diagnosis not present

## 2016-03-15 DIAGNOSIS — W268XXA Contact with other sharp object(s), not elsewhere classified, initial encounter: Secondary | ICD-10-CM | POA: Diagnosis not present

## 2016-03-15 DIAGNOSIS — IMO0002 Reserved for concepts with insufficient information to code with codable children: Secondary | ICD-10-CM

## 2016-03-15 DIAGNOSIS — Y9389 Activity, other specified: Secondary | ICD-10-CM | POA: Insufficient documentation

## 2016-03-15 DIAGNOSIS — Z79899 Other long term (current) drug therapy: Secondary | ICD-10-CM | POA: Diagnosis not present

## 2016-03-15 DIAGNOSIS — S61212A Laceration without foreign body of right middle finger without damage to nail, initial encounter: Secondary | ICD-10-CM | POA: Diagnosis not present

## 2016-03-15 DIAGNOSIS — Y9289 Other specified places as the place of occurrence of the external cause: Secondary | ICD-10-CM | POA: Diagnosis not present

## 2016-03-15 MED ORDER — ACETAMINOPHEN 325 MG PO TABS
650.0000 mg | ORAL_TABLET | Freq: Once | ORAL | Status: AC
  Administered 2016-03-15: 650 mg via ORAL
  Filled 2016-03-15: qty 2

## 2016-03-15 MED ORDER — LIDOCAINE HCL (PF) 1 % IJ SOLN
5.0000 mL | Freq: Once | INTRAMUSCULAR | Status: AC
  Administered 2016-03-15: 5 mL via INTRADERMAL
  Filled 2016-03-15: qty 5

## 2016-03-15 NOTE — ED Provider Notes (Signed)
CSN: [id number]     Arrival date & time 03/15/16  2123 History  By signing my name below, I, Evelene Croon, attest that this documentation has been prepared under the direction and in the presence of non-physician practitioner, Gloriann Loan, PA-C. Electronically Signed: Evelene Croon, Scribe. 03/15/2016. 10:26 PM.   Chief Complaint  Patient presents with  . Finger Injury   The history is provided by the patient. No language interpreter was used.     HPI Comments:  Jennifer Shepard is a 50 y.o. female who presents to the Emergency Department complaining of a laceration to her right middle finger, sustained ~1930 this evening. Pt states a blade in her dishwasher lacerated her finger. She reports associated moderate pain to the site. Tetanus is UTD within the last year. Pt was sent to ED from Urgent Care to have laceration repaired; bleeding controlled at urgent care.  Pt has no other complaints or symptoms at this time; denies numbness, weakness, fever, chills.  History reviewed. No pertinent past medical history. Past Surgical History  Procedure Laterality Date  . Appendectomy    . Lasik    . Cesarean section  01/2004  . Cesarean section  10/2005  . Thyroidectomy  07/2009    FOR THYROID CANCER; Dr Vicie Mutters  . Intrauterine device insertion  06/26/2015    Mirena   Family History  Problem Relation Age of Onset  . Hypertension Mother   . Hypertension Maternal Grandmother   . CAD Maternal Grandmother     CBAG in 24s; pacer  . Cancer Neg Hx   . Diabetes Neg Hx   . Stroke Neg Hx   . Hyperlipidemia Father    Social History  Substance Use Topics  . Smoking status: Never Smoker   . Smokeless tobacco: Never Used  . Alcohol Use: Yes     Comment: SOCIALLY ONLY   OB History    Gravida Para Term Preterm AB TAB SAB Ectopic Multiple Living   4 2   2     2      Review of Systems  Constitutional: Negative for fever.  Skin: Positive for wound.  Neurological: Negative for weakness.   Allergies   Cefaclor and Oxycodone-acetaminophen  Home Medications   Prior to Admission medications   Medication Sig Start Date End Date Taking? Authorizing Provider  ARMOUR THYROID 60 MG tablet Take 1 tablet by mouth daily. 08/26/15   Historical Provider, MD  cevimeline (EVOXAC) 30 MG capsule Take 30 mg by mouth 3 (three) times daily.    Historical Provider, MD  Cholecalciferol (VITAMIN D PO) Take 1 tablet by mouth daily.     Historical Provider, MD  gabapentin (NEURONTIN) 100 MG capsule One pill every eight hours as needed Patient not taking: Reported on 03/13/2016 10/05/15   Hendricks Limes, MD  metroNIDAZOLE (METROGEL VAGINAL) 0.75 % vaginal gel Place 1 Applicatorful vaginally 2 (two) times daily. For 5 days 03/13/16   Anastasio Auerbach, MD  naproxen sodium (ANAPROX) 220 MG tablet Take 220 mg by mouth daily.     Historical Provider, MD  valACYclovir (VALTREX) 500 MG tablet Take twice daily for 3-5 days as needed 08/03/15   Huel Cote, NP  VYVANSE 40 MG capsule Take 1 tablet by mouth daily. 05/25/15   Historical Provider, MD   BP 157/91 mmHg  Pulse 104  Temp(Src) 98.2 F (36.8 C) (Oral)  Resp 18  SpO2 100% Physical Exam  Constitutional: She is oriented to person, place, and time. She  appears well-developed and well-nourished.  HENT:  Head: Normocephalic and atraumatic.  Eyes: Conjunctivae are normal.  Neck: Normal range of motion.  Cardiovascular:  Capillary refill less than 3 seconds distal to injury.   Pulmonary/Chest: Effort normal. No respiratory distress.  Abdominal: She exhibits no distension.  Musculoskeletal: Normal range of motion.  Right middle finger: 1 cm superficial laceration over PIPJ. FAROM of PIPJ and DIPJ in flexion and extension.  No swelling, erythema.  Mild TTP.  Neurological: She is alert and oriented to person, place, and time.  Strength and sensation intact distal to injury.  Skin: Skin is warm and dry.  1 cm superficial laceration over right middle finger  PIPJ. No foreign bodies visualized or palpated in a bloodless field.   Nursing note and vitals reviewed.   ED Course  Procedures  NERVE BLOCK Performed by: Gloriann Loan Consent: Verbal consent obtained. Required items: required blood products, implants, devices, and special equipment available Time out: Immediately prior to procedure a "time out" was called to verify the correct patient, procedure, equipment, support staff and site/side marked as required.  Indication: laceration repair Nerve block body site: right middle finger  Preparation: Patient was prepped and draped in the usual sterile fashion. Needle gauge: 24 G Location technique: anatomical landmarks  Local anesthetic: 1% lidocaine without epinephrine  Anesthetic total: 5 ml  Outcome: pain improved Patient tolerance: Patient tolerated the procedure well with no immediate complications.  LACERATION REPAIR Performed by: Gloriann Loan Consent: Verbal consent obtained. Risks and benefits: risks, benefits and alternatives were discussed Patient identity confirmed: provided demographic data Time out performed prior to procedure Prepped and Draped in normal sterile fashion Wound explored Laceration Location: right middle PIPJ Laceration Length: 1cm No Foreign Bodies seen or palpated Anesthesia: local infiltration Local anesthetic: digital block Anesthetic total: 5 ml Irrigation method: syringe Amount of cleaning: standard Skin closure: 5-0 prolene Number of sutures or staples: 3 Technique: simple interrupted Patient tolerance: Patient tolerated the procedure well with no immediate complications.  DIAGNOSTIC STUDIES:  Oxygen Saturation is 100% on RA, normal by my interpretation.    COORDINATION OF CARE:  10:20 PM Discussed treatment plan with pt at bedside and pt agreed to plan.  Imaging No results found.  I have personally reviewed and evaluated these images and lab results as part of my medical  decision-making.    MDM   Tetanus is UTD. Laceration occurred < 12 hours prior to repair. Discussed laceration care with pt and answered questions. Xray negative for acute fracture or dislocation.   Subcutaneous foreign material at skin surface is from silver nitrate application at [facility].Pt to f-u for suture removal in 7-10 days and wound check sooner should there be signs of dehiscence or infection. Pt is hemodynamically stable with no complaints prior to dc.    Final diagnoses:  Laceration   I personally performed the services described in this documentation, which was scribed in my presence. The recorded information has been reviewed and is accurate.    Gloriann Loan, PA-C 03/15/16 2304  Virgel Manifold, MD 03/16/16 2219

## 2016-03-15 NOTE — ED Notes (Signed)
Pt was reaching into the dishwasher and was cut on her right hand middle finger by the dishwasher blade. Last tetanus shot April 2016.

## 2016-03-15 NOTE — Discharge Instructions (Signed)

## 2016-03-15 NOTE — ED Notes (Signed)
Pt here from urgent care, states they did not have the tools to stitch her finger.

## 2016-03-23 ENCOUNTER — Ambulatory Visit
Admission: RE | Admit: 2016-03-23 | Discharge: 2016-03-23 | Disposition: A | Discharge status: Home / Self Care | Payer: BLUE CROSS/BLUE SHIELD | Source: Ambulatory Visit

## 2016-03-23 DIAGNOSIS — Z1231 Encounter for screening mammogram for malignant neoplasm of breast: Secondary | ICD-10-CM

## 2016-03-26 ENCOUNTER — Encounter: Payer: Self-pay | Admitting: Women's Health

## 2016-06-01 ENCOUNTER — Encounter: Payer: Self-pay | Admitting: Family Medicine

## 2016-06-01 ENCOUNTER — Ambulatory Visit (INDEPENDENT_AMBULATORY_CARE_PROVIDER_SITE_OTHER): Payer: BLUE CROSS/BLUE SHIELD | Admitting: Family Medicine

## 2016-06-01 VITALS — BP 142/96 | HR 92 | Temp 98.0°F | Ht 69.0 in | Wt 184.1 lb

## 2016-06-01 DIAGNOSIS — R030 Elevated blood-pressure reading, without diagnosis of hypertension: Secondary | ICD-10-CM

## 2016-06-01 DIAGNOSIS — R040 Epistaxis: Secondary | ICD-10-CM

## 2016-06-01 NOTE — Progress Notes (Signed)
Subjective:    Patient ID: Jennifer Shepard, female    DOB: February 28, 1966, 50 y.o.   MRN: [id number]  HPI  Jennifer Shepard is a 50 year old female who presents today with intermittent nosebleeds reported as 2 over the past 4 days.that resolved spontaneously with pressure without difficulty.  She denies having frequent nosebleeds and states that this is her first episode in over 2 1/2 years. She reports a  family history with her father having nosebleeds that required caterization. Pertinent history includes daily vyvanse which she has taken for many years. She does not take any ASA, warfarin, and does not bleed or bruise easily. History of Thyroid Cancer and subsequent treatment has led to extreme dry mouth and dry eye. She is followed by endocrinology for her thyroid replacement per patient report. She denies rhinitis and itchy/watery eyes or post nasal drip today.  BP is elevated today and she does not have a history of HTN. Patient states that she was encouraged to make this appointment because she was informed by a friend that her nosebleeds could be related to elevated BP. Retake of BP is noted as 142/96. She denies chest pain, palpitations, SOB, headaches, numbness, and tingling.  She denies a nosebleed today. She does not monitor her BP at home and diet that consists of "junk food" usually noted as fried from fast food restaurants.      Review of Systems  Constitutional: Negative for fever, chills and fatigue.  HENT: Positive for nosebleeds. Negative for congestion, postnasal drip, rhinorrhea, sinus pressure and sneezing.   Eyes: Negative for visual disturbance.       Dry eye and mouth. History of radiation due to thyroid cancer treatment.  Respiratory: Negative for cough, shortness of breath and wheezing.   Cardiovascular: Negative for chest pain, palpitations and leg swelling.  Gastrointestinal: Negative for nausea, vomiting, abdominal pain, diarrhea and constipation.  Endocrine: Negative for  polydipsia, polyphagia and polyuria.  Skin: Negative for rash.  Neurological: Negative for dizziness, light-headedness and headaches.  Psychiatric/Behavioral:       Denies depressed or anxious mood   No past medical history on file.   Social History   Social History  . Marital Status: Divorced    Spouse Name: N/A  . Number of Children: N/A  . Years of Education: N/A   Occupational History  . Not on file.   Social History Main Topics  . Smoking status: Never Smoker   . Smokeless tobacco: Never Used  . Alcohol Use: Yes     Comment: SOCIALLY ONLY  . Drug Use: No  . Sexual Activity: No     Comment: Mirena IUD 07/23/2015   Other Topics Concern  . Not on file   Social History Narrative    Past Surgical History  Procedure Laterality Date  . Appendectomy    . Lasik    . Cesarean section  01/2004  . Cesarean section  10/2005  . Thyroidectomy  07/2009    FOR THYROID CANCER; Dr Vicie Mutters  . Intrauterine device insertion  06/26/2015    Mirena    Family History  Problem Relation Age of Onset  . Hypertension Mother   . Hypertension Maternal Grandmother   . CAD Maternal Grandmother     CBAG in 72s; pacer  . Cancer Neg Hx   . Diabetes Neg Hx   . Stroke Neg Hx   . Hyperlipidemia Father     Allergies  Allergen Reactions  . Cefaclor  Angioedema of lips  . Oxycodone-Acetaminophen     itching    Current Outpatient Prescriptions on File Prior to Visit  Medication Sig Dispense Refill  . ARMOUR THYROID 60 MG tablet Take 1 tablet by mouth daily.  0  . cevimeline (EVOXAC) 30 MG capsule Take 30 mg by mouth 3 (three) times daily.    . Cholecalciferol (VITAMIN D PO) Take 1 tablet by mouth daily.     . naproxen sodium (ANAPROX) 220 MG tablet Take 220 mg by mouth daily. Take every day    . valACYclovir (VALTREX) 500 MG tablet Take twice daily for 3-5 days as needed 30 tablet 12  . VYVANSE 40 MG capsule Take 1 tablet by mouth daily.  0   Current Facility-Administered  Medications on File Prior to Visit  Medication Dose Route Frequency Provider Last Rate Last Dose  . levonorgestrel (MIRENA) 20 MCG/24HR IUD   Intrauterine Once Timothy P Fontaine, MD        BP 142/96 mmHg  Pulse 92  Temp(Src) 98 F (36.7 C) (Oral)  Ht 5\' 9"  (1.753 m)  Wt 184 lb 1 oz (83.49 kg)  BMI 27.17 kg/m2  SpO2 97%       Objective:   Physical Exam  Constitutional: She is oriented to person, place, and time. She appears well-developed and well-nourished.  HENT:  Right Ear: Tympanic membrane normal.  Left Ear: Tympanic membrane normal.  Nose: No rhinorrhea or nose lacerations. No epistaxis.  No foreign bodies.  Mouth/Throat: No oropharyngeal exudate or posterior oropharyngeal erythema.  Nares examined. No lacerations, excoriations, or mucosal trauma visible.   Eyes: Pupils are equal, round, and reactive to light. No scleral icterus.  Neck: Neck supple.  Cardiovascular: Normal rate, regular rhythm, normal heart sounds and intact distal pulses.  Exam reveals no gallop and no friction rub.   No murmur heard. Pulmonary/Chest: Effort normal and breath sounds normal. She has no wheezes. She has no rales.  Musculoskeletal: She exhibits no edema.  Lymphadenopathy:    She has no cervical adenopathy.  Neurological: She is alert and oriented to person, place, and time.  Skin: Skin is warm and dry. No rash noted.   No past medical history on file.   Social History   Social History  . Marital Status: Divorced    Spouse Name: N/A  . Number of Children: N/A  . Years of Education: N/A   Occupational History  . Not on file.   Social History Main Topics  . Smoking status: Never Smoker   . Smokeless tobacco: Never Used  . Alcohol Use: Yes     Comment: SOCIALLY ONLY  . Drug Use: No  . Sexual Activity: No     Comment: Mirena IUD 07/23/2015   Other Topics Concern  . Not on file   Social History Narrative    Past Surgical History  Procedure Laterality Date  .  Appendectomy    . Lasik    . Cesarean section  01/2004  . Cesarean section  10/2005  . Thyroidectomy  07/2009    FOR THYROID CANCER; Dr Vicie Mutters  . Intrauterine device insertion  06/26/2015    Mirena    Family History  Problem Relation Age of Onset  . Hypertension Mother   . Hypertension Maternal Grandmother   . CAD Maternal Grandmother     CBAG in 56s; pacer  . Cancer Neg Hx   . Diabetes Neg Hx   . Stroke Neg Hx   . Hyperlipidemia Father  Allergies  Allergen Reactions  . Cefaclor     Angioedema of lips  . Oxycodone-Acetaminophen     itching    Current Outpatient Prescriptions on File Prior to Visit  Medication Sig Dispense Refill  . ARMOUR THYROID 60 MG tablet Take 1 tablet by mouth daily.  0  . cevimeline (EVOXAC) 30 MG capsule Take 30 mg by mouth 3 (three) times daily.    . Cholecalciferol (VITAMIN D PO) Take 1 tablet by mouth daily.     . naproxen sodium (ANAPROX) 220 MG tablet Take 220 mg by mouth daily. Take every day    . valACYclovir (VALTREX) 500 MG tablet Take twice daily for 3-5 days as needed 30 tablet 12  . VYVANSE 40 MG capsule Take 1 tablet by mouth daily.  0   Current Facility-Administered Medications on File Prior to Visit  Medication Dose Route Frequency Provider Last Rate Last Dose  . levonorgestrel (MIRENA) 20 MCG/24HR IUD   Intrauterine Once Timothy P Fontaine, MD        BP 142/96 mmHg  Pulse 92  Temp(Src) 98 F (36.7 C) (Oral)  Ht 5\' 9"  (1.753 m)  Wt 184 lb 1 oz (83.49 kg)  BMI 27.17 kg/m2  SpO2 97%      Assessment & Plan:  1. Elevated blood pressure reading without diagnosis of hypertension Review of record indicates elevated blood pressure previously. Patient was advised to monitor her blood pressure and follow up for evaluation. Patient does not monitor her BP and noted that her BP was WNL at subsequent visits. Advised patient to monitor BP at home and directions were provided including parameters to report. Also recommended that she  bring her home BP cuff with her to her next visit so the cuff can be evaluated for accuracy.  Recommended follow up with her PCP with these readings. Significant symptoms to seek medical attention were discussed such as chest pain, palpitations, SOB, headaches, nosebleeds, or numbness and tingling. DASH Eating Plan discussed with therapeutic lifestyle changes.  2. Right-sided nosebleed Suspect that nosebleed may be related to mucosal trauma/excoriation from prior symptoms of allergic rhinitis or dryness due to history of dry eye/mouth secondary to radiation therapy. Further evaluation can be completed by an ENT if this problem persists due to family history of nosebleeds which required cauterization.  Recommended follow up in 1 to 2 weeks with PCP for BP evaluation or earlier if nosebleeds continue to occur. Patient voiced understanding and agreed with plan.  Delano Metz, FNP-C

## 2016-06-01 NOTE — Progress Notes (Signed)
Pre visit review using our clinic review tool, if applicable. No additional management support is needed unless otherwise documented below in the visit note. 

## 2016-06-01 NOTE — Patient Instructions (Signed)
Minimal Blood Pressure Goal= AVERAGE < 140/90; Ideal is an AVERAGE < 135/85. This AVERAGE should be calculated from @ least 5-7 BP readings taken @ different times of day on different days of week. You should not respond to isolated BP readings , but rather the AVERAGE for that week .Please bring your blood pressure cuff to office visits to verify that it is reliable.It can also be checked against the blood pressure device at the pharmacy. Finger or wrist cuffs are not dependable; an arm cuff is.  Omron cuff is the option we discussed.  Please follow up in 1 to 2 weeks or soon if blood pressure averages are increasing above the readings listed or seek urgent medical attention if you notice any chest pain, palpitations, shortness or breath, headaches, and nosebleeds   DASH Eating Plan DASH stands for "Dietary Approaches to Stop Hypertension." The DASH eating plan is a healthy eating plan that has been shown to reduce high blood pressure (hypertension). Additional health benefits may include reducing the risk of type 2 diabetes mellitus, heart disease, and stroke. The DASH eating plan may also help with weight loss. WHAT DO I NEED TO KNOW ABOUT THE DASH EATING PLAN? For the DASH eating plan, you will follow these general guidelines:  Choose foods with a percent daily value for sodium of less than 5% (as listed on the food label).  Use salt-free seasonings or herbs instead of table salt or sea salt.  Check with your health care provider or pharmacist before using salt substitutes.  Eat lower-sodium products, often labeled as "lower sodium" or "no salt added."  Eat fresh foods.  Eat more vegetables, fruits, and low-fat dairy products.  Choose whole grains. Look for the word "whole" as the first word in the ingredient list.  Choose fish and skinless chicken or [place] more often than red meat. Limit fish, poultry, and meat to 6 oz (170 g) each day.  Limit sweets, desserts, sugars, and sugary  drinks.  Choose heart-healthy fats.  Limit cheese to 1 oz (28 g) per day.  Eat more home-cooked food and less restaurant, buffet, and fast food.  Limit fried foods.  Cook foods using methods other than frying.  Limit canned vegetables. If you do use them, rinse them well to decrease the sodium.  When eating at a restaurant, ask that your food be prepared with less salt, or no salt if possible. WHAT FOODS CAN I EAT? Seek help from a dietitian for individual calorie needs. Grains Whole grain or whole wheat bread. Brown rice. Whole grain or whole wheat pasta. Quinoa, bulgur, and whole grain cereals. Low-sodium cereals. Corn or whole wheat flour tortillas. Whole grain cornbread. Whole grain crackers. Low-sodium crackers. Vegetables Fresh or frozen vegetables (raw, steamed, roasted, or grilled). Low-sodium or reduced-sodium tomato and vegetable juices. Low-sodium or reduced-sodium tomato sauce and paste. Low-sodium or reduced-sodium canned vegetables.  Fruits All fresh, canned (in natural juice), or frozen fruits. Meat and Other Protein Products Ground beef (85% or leaner), grass-fed beef, or beef trimmed of fat. Skinless chicken or [place]. Ground chicken or [place]. Pork trimmed of fat. All fish and seafood. Eggs. Dried beans, peas, or lentils. Unsalted nuts and seeds. Unsalted canned beans. Dairy Low-fat dairy products, such as skim or 1% milk, 2% or reduced-fat cheeses, low-fat ricotta or cottage cheese, or plain low-fat yogurt. Low-sodium or reduced-sodium cheeses. Fats and Oils Tub margarines without trans fats. Light or reduced-fat mayonnaise and salad dressings (reduced sodium). Avocado. Safflower, olive, or canola  oils. Natural peanut or almond butter. Other Unsalted popcorn and pretzels. The items listed above may not be a complete list of recommended foods or beverages. Contact your dietitian for more options. WHAT FOODS ARE NOT RECOMMENDED? Grains White bread. White pasta.  White rice. Refined cornbread. Bagels and croissants. Crackers that contain trans fat. Vegetables Creamed or fried vegetables. Vegetables in a cheese sauce. Regular canned vegetables. Regular canned tomato sauce and paste. Regular tomato and vegetable juices. Fruits Dried fruits. Canned fruit in light or heavy syrup. Fruit juice. Meat and Other Protein Products Fatty cuts of meat. Ribs, chicken wings, bacon, sausage, bologna, salami, chitterlings, fatback, hot dogs, bratwurst, and packaged luncheon meats. Salted nuts and seeds. Canned beans with salt. Dairy Whole or 2% milk, cream, half-and-half, and cream cheese. Whole-fat or sweetened yogurt. Full-fat cheeses or blue cheese. Nondairy creamers and whipped toppings. Processed cheese, cheese spreads, or cheese curds. Condiments Onion and garlic salt, seasoned salt, table salt, and sea salt. Canned and packaged gravies. Worcestershire sauce. Tartar sauce. Barbecue sauce. Teriyaki sauce. Soy sauce, including reduced sodium. Steak sauce. Fish sauce. Oyster sauce. Cocktail sauce. Horseradish. Ketchup and mustard. Meat flavorings and tenderizers. Bouillon cubes. Hot sauce. Tabasco sauce. Marinades. Taco seasonings. Relishes. Fats and Oils Butter, stick margarine, lard, shortening, ghee, and bacon fat. Coconut, palm kernel, or palm oils. Regular salad dressings. Other Pickles and olives. Salted popcorn and pretzels. The items listed above may not be a complete list of foods and beverages to avoid. Contact your dietitian for more information. WHERE CAN I FIND MORE INFORMATION? National Heart, Lung, and Blood Institute: travelstabloid.com   This information is not intended to replace advice given to you by your health care provider. Make sure you discuss any questions you have with your health care provider.   Document Released: 11/30/2011 Document Revised: 01/01/2015 Document Reviewed: 10/15/2013 Elsevier Interactive  Patient Education [employer].

## 2016-06-02 DIAGNOSIS — R040 Epistaxis: Secondary | ICD-10-CM | POA: Insufficient documentation

## 2016-06-21 ENCOUNTER — Ambulatory Visit: Payer: BLUE CROSS/BLUE SHIELD | Admitting: Family Medicine

## 2016-07-03 DIAGNOSIS — C73 Malignant neoplasm of thyroid gland: Secondary | ICD-10-CM | POA: Diagnosis not present

## 2016-07-06 ENCOUNTER — Ambulatory Visit: Payer: BLUE CROSS/BLUE SHIELD | Admitting: Family Medicine

## 2016-07-28 DIAGNOSIS — F988 Other specified behavioral and emotional disorders with onset usually occurring in childhood and adolescence: Secondary | ICD-10-CM | POA: Insufficient documentation

## 2016-07-28 DIAGNOSIS — R030 Elevated blood-pressure reading, without diagnosis of hypertension: Secondary | ICD-10-CM | POA: Diagnosis not present

## 2016-07-28 DIAGNOSIS — Z79899 Other long term (current) drug therapy: Secondary | ICD-10-CM | POA: Diagnosis not present

## 2016-08-01 ENCOUNTER — Telehealth: Payer: Self-pay | Admitting: *Deleted

## 2016-08-01 MED ORDER — FLUCONAZOLE 150 MG PO TABS: 150.0000 mg | ORAL_TABLET | Freq: Once | ORAL | 0 refills | Status: AC

## 2016-08-01 NOTE — Telephone Encounter (Signed)
You are back up MD) pt called c/o yeast infection itching and irritation asked diflucan tablet could be sent to pharmacy?

## 2016-08-01 NOTE — Telephone Encounter (Signed)
Diflucan 150 mg 1 dose okay office visit if symptoms continue.

## 2016-08-01 NOTE — Telephone Encounter (Signed)
Pt aware, pt Rx

## 2016-08-02 ENCOUNTER — Ambulatory Visit: Payer: BLUE CROSS/BLUE SHIELD | Admitting: Gynecology

## 2016-08-10 DIAGNOSIS — C73 Malignant neoplasm of thyroid gland: Secondary | ICD-10-CM | POA: Diagnosis not present

## 2016-08-29 ENCOUNTER — Encounter: Payer: Self-pay | Admitting: Women's Health

## 2016-08-29 ENCOUNTER — Ambulatory Visit (INDEPENDENT_AMBULATORY_CARE_PROVIDER_SITE_OTHER): Payer: BLUE CROSS/BLUE SHIELD | Admitting: Women's Health

## 2016-08-29 VITALS — BP 126/80 | Ht 69.0 in | Wt 182.0 lb

## 2016-08-29 DIAGNOSIS — Z13220 Encounter for screening for lipoid disorders: Secondary | ICD-10-CM | POA: Diagnosis not present

## 2016-08-29 DIAGNOSIS — Z01419 Encounter for gynecological examination (general) (routine) without abnormal findings: Secondary | ICD-10-CM | POA: Diagnosis not present

## 2016-08-29 DIAGNOSIS — N898 Other specified noninflammatory disorders of vagina: Secondary | ICD-10-CM | POA: Diagnosis not present

## 2016-08-29 DIAGNOSIS — B009 Herpesviral infection, unspecified: Secondary | ICD-10-CM

## 2016-08-29 LAB — CBC WITH DIFFERENTIAL/PLATELET
BASOS ABS: 56 {cells}/uL (ref 0–200)
BASOS PCT: 1 %
EOS PCT: 0 %
Eosinophils Absolute: 0 cells/uL — ABNORMAL LOW (ref 15–500)
HCT: 40.8 % (ref 35.0–45.0)
HEMOGLOBIN: 13.7 g/dL (ref 11.7–15.5)
LYMPHS ABS: 1176 {cells}/uL (ref 850–3900)
Lymphocytes Relative: 21 %
MCH: 32.2 pg (ref 27.0–33.0)
MCHC: 33.6 g/dL (ref 32.0–36.0)
MCV: 96 fL (ref 80.0–100.0)
MONOS PCT: 10 %
MPV: 10 fL (ref 7.5–12.5)
Monocytes Absolute: 560 cells/uL (ref 200–950)
NEUTROS ABS: 3808 {cells}/uL (ref 1500–7800)
Neutrophils Relative %: 68 %
PLATELETS: 277 10*3/uL (ref 140–400)
RBC: 4.25 MIL/uL (ref 3.80–5.10)
RDW: 13.3 % (ref 11.0–15.0)
WBC: 5.6 10*3/uL (ref 3.8–10.8)

## 2016-08-29 LAB — COMPREHENSIVE METABOLIC PANEL
ALBUMIN: 4.5 g/dL (ref 3.6–5.1)
ALK PHOS: 51 U/L (ref 33–115)
ALT: 17 U/L (ref 6–29)
AST: 20 U/L (ref 10–35)
BUN: 11 mg/dL (ref 7–25)
CALCIUM: 9.6 mg/dL (ref 8.6–10.2)
CHLORIDE: 101 mmol/L (ref 98–110)
CO2: 30 mmol/L (ref 20–31)
Creat: 0.73 mg/dL (ref 0.50–1.10)
Glucose, Bld: 97 mg/dL (ref 65–99)
POTASSIUM: 4.9 mmol/L (ref 3.5–5.3)
SODIUM: 137 mmol/L (ref 135–146)
Total Bilirubin: 0.6 mg/dL (ref 0.2–1.2)
Total Protein: 7.3 g/dL (ref 6.1–8.1)

## 2016-08-29 LAB — LIPID PANEL
CHOL/HDL RATIO: 1.7 ratio (ref <=5.0)
CHOLESTEROL: 192 mg/dL (ref 125–200)
HDL: 116 mg/dL (ref >=46)
LDL Cholesterol: 64 mg/dL (ref <130)
Triglycerides: 58 mg/dL (ref <150)
VLDL: 12 mg/dL (ref <30)

## 2016-08-29 LAB — WET PREP FOR TRICH, YEAST, CLUE
Clue Cells Wet Prep HPF POC: NONE SEEN
TRICH WET PREP: NONE SEEN
YEAST WET PREP: NONE SEEN

## 2016-08-29 MED ORDER — FLUCONAZOLE 150 MG PO TABS: 150.0000 mg | ORAL_TABLET | Freq: Once | ORAL | 1 refills | Status: AC

## 2016-08-29 MED ORDER — VALACYCLOVIR HCL 500 MG PO TABS: ORAL_TABLET | ORAL | 12 refills | Status: DC

## 2016-08-29 MED ORDER — METRONIDAZOLE 0.75 % VA GEL: VAGINAL | 0 refills | Status: DC

## 2016-08-29 NOTE — Progress Notes (Addendum)
GENELDA GIAMBRA 10/16/1966 [id number]    History:    Presents for annual exam.  07/2015 Mirena IUD rare spotting. Same partner with negative STD screen. 07/2009 thyroidectomy for thyroid cancer endocrinologist manages. Normal Pap and mammogram history has had a benign breast biopsy. HSV rare outbreaks.   Past medical history, past surgical history, family history and social history were all reviewed and documented in the EPIC chart.  Works in [employer] under[employer]. Elmyra Ricks 13, Mason 10 both doing well. Mother hypertension.  ROS:  A ROS was performed and pertinent positives and negatives are included.  Exam:  Vitals:   08/29/16 0932  BP: 126/80  Weight: 182 lb (82.6 kg)  Height: 5\' 9"  (1.753 m)   Body mass index is 26.88 kg/m.   General appearance:  Normal Thyroid:  Symmetrical, normal in size, without palpable masses or nodularity. Respiratory  Auscultation:  Clear without wheezing or rhonchi Cardiovascular  Auscultation:  Regular rate, without rubs, murmurs or gallops  Edema/varicosities:  Not grossly evident Abdominal  Soft,nontender, without masses, guarding or rebound.  Liver/spleen:  No organomegaly noted  Hernia:  None appreciated  Skin  Inspection:  Grossly normal   Breasts: Examined lying and sitting.     Right: Without masses, retractions, discharge or axillary adenopathy.     Left: Without masses, retractions, discharge or axillary adenopathy. Gentitourinary   Inguinal/mons:  Normal without inguinal adenopathy  External genitalia:  Normal  BUS/Urethra/Skene's glands:  Normal  Vagina:  Normal  Cervix:  Normal  Uterus:   normal in size, shape and contour.  Midline and mobile  Adnexa/parametria:     Rt: Without masses or tenderness.   Lt: Without masses or tenderness.  Anus and perineum: Normal  Digital rectal exam: Normal sphincter tone without palpated masses or tenderness  Assessment/Plan:  50 y.o. DWF G2 P2  for annual exam with complaint of vaginal discharge with a  negative wet prep.  07/2015 Mirena IUD-rare spotting Borderline blood pressure today good 07/2009 thyroidectomy for thyroid cancer endocrinologist manages Synthroid HSV-rare outbreaks  Plan: Continue blood pressure home monitoring return to family practice for blood pressures greater than 130/80. SBE's, continue annual screening mammogram, calcium rich diet, vitamin D 1000 daily encouraged. Reviewed importance of increasing regular cardiac type exercise. CBC, CMP, lipid panel, UA, Pap normal with negative HR HPV typing new screening guidelines reviewed. Prescription for MetroGel vaginal cream and Diflucan 150 per request to have on hand if symptoms arise has history of recurrent BV and yeast. Valtrex 500 twice daily for 3-5 days when necessary prescription given.    Huel Cote [facility], 9:55 AM 08/29/2016

## 2016-08-29 NOTE — Patient Instructions (Signed)
Health Maintenance, Female Adopting a healthy lifestyle and getting preventive care can go a long way to promote health and wellness. Talk with your health care provider about what schedule of regular examinations is right for you. This is a good chance for you to check in with your provider about disease prevention and staying healthy. In between checkups, there are plenty of things you can do on your own. Experts have done a lot of research about which lifestyle changes and preventive measures are most likely to keep you healthy. Ask your health care provider for more information. WEIGHT AND DIET  Eat a healthy diet  Be sure to include plenty of vegetables, fruits, low-fat dairy products, and lean protein.  Do not eat a lot of foods high in solid fats, added sugars, or salt.  Get regular exercise. This is one of the most important things you can do for your health.  Most adults should exercise for at least 150 minutes each week. The exercise should increase your heart rate and make you sweat (moderate-intensity exercise).  Most adults should also do strengthening exercises at least twice a week. This is in addition to the moderate-intensity exercise.  Maintain a healthy weight  Body mass index (BMI) is a measurement that can be used to identify possible weight problems. It estimates body fat based on height and weight. Your health care provider can help determine your BMI and help you achieve or maintain a healthy weight.  For females 20 years of age and older:   A BMI below 18.5 is considered underweight.  A BMI of 18.5 to 24.9 is normal.  A BMI of 25 to 29.9 is considered overweight.  A BMI of 30 and above is considered obese.  Watch levels of cholesterol and blood lipids  You should start having your blood tested for lipids and cholesterol at 49 years of age, then have this test every 5 years.  You may need to have your cholesterol levels checked more often if:  Your lipid  or cholesterol levels are high.  You are older than 50 years of age.  You are at high risk for heart disease.  CANCER SCREENING   Lung Cancer  Lung cancer screening is recommended for adults 55-80 years old who are at high risk for lung cancer because of a history of smoking.  A yearly low-dose CT scan of the lungs is recommended for people who:  Currently smoke.  Have quit within the past 15 years.  Have at least a 30-pack-year history of smoking. A pack year is smoking an average of one pack of cigarettes a day for 1 year.  Yearly screening should continue until it has been 15 years since you quit.  Yearly screening should stop if you develop a health problem that would prevent you from having lung cancer treatment.  Breast Cancer  Practice breast self-awareness. This means understanding how your breasts normally appear and feel.  It also means doing regular breast self-exams. Let your health care provider know about any changes, no matter how small.  If you are in your 20s or 30s, you should have a clinical breast exam (CBE) by a health care provider every 1-3 years as part of a regular health exam.  If you are 40 or older, have a CBE every year. Also consider having a breast X-ray (mammogram) every year.  If you have a family history of breast cancer, talk to your health care provider about genetic screening.  If you   are at high risk for breast cancer, talk to your health care provider about having an MRI and a mammogram every year.  Breast cancer gene (BRCA) assessment is recommended for women who have family members with BRCA-related cancers. BRCA-related cancers include:  Breast.  Ovarian.  Tubal.  Peritoneal cancers.  Results of the assessment will determine the need for genetic counseling and BRCA1 and BRCA2 testing. Cervical Cancer Your health care provider may recommend that you be screened regularly for cancer of the pelvic organs (ovaries, uterus, and  vagina). This screening involves a pelvic examination, including checking for microscopic changes to the surface of your cervix (Pap test). You may be encouraged to have this screening done every 3 years, beginning at age 21.  For women ages 30-65, health care providers may recommend pelvic exams and Pap testing every 3 years, or they may recommend the Pap and pelvic exam, combined with testing for human papilloma virus (HPV), every 5 years. Some types of HPV increase your risk of cervical cancer. Testing for HPV may also be done on women of any age with unclear Pap test results.  Other health care providers may not recommend any screening for nonpregnant women who are considered low risk for pelvic cancer and who do not have symptoms. Ask your health care provider if a screening pelvic exam is right for you.  If you have had past treatment for cervical cancer or a condition that could lead to cancer, you need Pap tests and screening for cancer for at least 20 years after your treatment. If Pap tests have been discontinued, your risk factors (such as having a new sexual partner) need to be reassessed to determine if screening should resume. Some women have medical problems that increase the chance of getting cervical cancer. In these cases, your health care provider may recommend more frequent screening and Pap tests. Colorectal Cancer  This type of cancer can be detected and often prevented.  Routine colorectal cancer screening usually begins at 50 years of age and continues through 49 years of age.  Your health care provider may recommend screening at an earlier age if you have risk factors for colon cancer.  Your health care provider may also recommend using home test kits to check for hidden blood in the stool.  A small camera at the end of a tube can be used to examine your colon directly (sigmoidoscopy or colonoscopy). This is done to check for the earliest forms of colorectal  cancer.  Routine screening usually begins at age 50.  Direct examination of the colon should be repeated every 5-10 years through 49 years of age. However, you may need to be screened more often if early forms of precancerous polyps or small growths are found. Skin Cancer  Check your skin from head to toe regularly.  Tell your health care provider about any new moles or changes in moles, especially if there is a change in a mole's shape or color.  Also tell your health care provider if you have a mole that is larger than the size of a pencil eraser.  Always use sunscreen. Apply sunscreen liberally and repeatedly throughout the day.  Protect yourself by wearing long sleeves, pants, a wide-brimmed hat, and sunglasses whenever you are outside. HEART DISEASE, DIABETES, AND HIGH BLOOD PRESSURE   High blood pressure causes heart disease and increases the risk of stroke. High blood pressure is more likely to develop in:  People who have blood pressure in the high end   of the normal range (130-139/85-89 mm Hg).  People who are overweight or obese.  People who are African American.  If you are 38-23 years of age, have your blood pressure checked every 3-5 years. If you are 61 years of age or older, have your blood pressure checked every year. You should have your blood pressure measured twice--once when you are at a hospital or clinic, and once when you are not at a hospital or clinic. Record the average of the two measurements. To check your blood pressure when you are not at a hospital or clinic, you can use:  An automated blood pressure machine at a pharmacy.  A home blood pressure monitor.  If you are between 45 years and 39 years old, ask your health care provider if you should take aspirin to prevent strokes.  Have regular diabetes screenings. This involves taking a blood sample to check your fasting blood sugar level.  If you are at a normal weight and have a low risk for diabetes,  have this test once every three years after 49 years of age.  If you are overweight and have a high risk for diabetes, consider being tested at a younger age or more often. PREVENTING INFECTION  Hepatitis B  If you have a higher risk for hepatitis B, you should be screened for this virus. You are considered at high risk for hepatitis B if:  You were born in a country where hepatitis B is common. Ask your health care provider which countries are considered high risk.  Your parents were born in a high-risk country, and you have not been immunized against hepatitis B (hepatitis B vaccine).  You have HIV or AIDS.  You use needles to inject street drugs.  You live with someone who has hepatitis B.  You have had sex with someone who has hepatitis B.  You get hemodialysis treatment.  You take certain medicines for conditions, including cancer, organ transplantation, and autoimmune conditions. Hepatitis C  Blood testing is recommended for:  Everyone born from [birth year] through 1964.  Anyone with known risk factors for hepatitis C. Sexually transmitted infections (STIs)  You should be screened for sexually transmitted infections (STIs) including gonorrhea and chlamydia if:  You are sexually active and are younger than 49 years of age.  You are older than 49 years of age and your health care provider tells you that you are at risk for this type of infection.  Your sexual activity has changed since you were last screened and you are at an increased risk for chlamydia or gonorrhea. Ask your health care provider if you are at risk.  If you do not have HIV, but are at risk, it may be recommended that you take a prescription medicine daily to prevent HIV infection. This is called pre-exposure prophylaxis (PrEP). You are considered at risk if:  You are sexually active and do not regularly use condoms or know the HIV status of your partner(s).  You take drugs by injection.  You are sexually  active with a partner who has HIV. Talk with your health care provider about whether you are at high risk of being infected with HIV. If you choose to begin PrEP, you should first be tested for HIV. You should then be tested every 3 months for as long as you are taking PrEP.  PREGNANCY   If you are premenopausal and you may become pregnant, ask your health care provider about preconception counseling.  If you may  become pregnant, take 400 to 800 micrograms (mcg) of folic acid every day.  If you want to prevent pregnancy, talk to your health care provider about birth control (contraception). OSTEOPOROSIS AND MENOPAUSE   Osteoporosis is a disease in which the bones lose minerals and strength with aging. This can result in serious bone fractures. Your risk for osteoporosis can be identified using a bone density scan.  If you are 61 years of age or older, or if you are at risk for osteoporosis and fractures, ask your health care provider if you should be screened.  Ask your health care provider whether you should take a calcium or vitamin D supplement to lower your risk for osteoporosis.  Menopause may have certain physical symptoms and risks.  Hormone replacement therapy may reduce some of these symptoms and risks. Talk to your health care provider about whether hormone replacement therapy is right for you.  HOME CARE INSTRUCTIONS   Schedule regular health, dental, and eye exams.  Stay current with your immunizations.   Do not use any tobacco products including cigarettes, chewing tobacco, or electronic cigarettes.  If you are pregnant, do not drink alcohol.  If you are breastfeeding, limit how much and how often you drink alcohol.  Limit alcohol intake to no more than 1 drink per day for nonpregnant women. One drink equals 12 ounces of beer, 5 ounces of wine, or 1 ounces of hard liquor.  Do not use street drugs.  Do not share needles.  Ask your health care provider for help if  you need support or information about quitting drugs.  Tell your health care provider if you often feel depressed.  Tell your health care provider if you have ever been abused or do not feel safe at home.   This information is not intended to replace advice given to you by your health care provider. Make sure you discuss any questions you have with your health care provider.   Document Released: 06/26/2011 Document Revised: 01/01/2015 Document Reviewed: 11/12/2013 Elsevier Interactive Patient Education [employer].

## 2016-08-30 ENCOUNTER — Encounter: Payer: Self-pay | Admitting: Women's Health

## 2016-08-30 LAB — URINALYSIS W MICROSCOPIC + REFLEX CULTURE
BACTERIA UA: NONE SEEN [HPF]
BILIRUBIN URINE: NEGATIVE
CRYSTALS: NONE SEEN [HPF]
Casts: NONE SEEN [LPF]
Glucose, UA: NEGATIVE
HGB URINE DIPSTICK: NEGATIVE
KETONES UR: NEGATIVE
Leukocytes, UA: NEGATIVE
Nitrite: NEGATIVE
PROTEIN: NEGATIVE
RBC / HPF: NONE SEEN RBC/HPF (ref <=2)
SPECIFIC GRAVITY, URINE: 1.006 (ref 1.001–1.035)
SQUAMOUS EPITHELIAL / LPF: NONE SEEN [HPF] (ref <=5)
WBC UA: NONE SEEN WBC/HPF (ref <=5)
Yeast: NONE SEEN [HPF]
pH: 7.5 (ref 5.0–8.0)

## 2016-09-05 ENCOUNTER — Encounter: Payer: BLUE CROSS/BLUE SHIELD | Admitting: Internal Medicine

## 2016-09-05 DIAGNOSIS — E039 Hypothyroidism, unspecified: Secondary | ICD-10-CM | POA: Insufficient documentation

## 2016-09-05 NOTE — Progress Notes (Signed)
    Subjective:    Patient ID: Jennifer Shepard, female    DOB: Apr 26, 1966, 50 y.o.   MRN: [id number]  HPI NO SHOW     Medications and allergies reviewed with patient and updated if appropriate.  Patient Active Problem List   Diagnosis Date Noted  . Right-sided nosebleed 06/02/2016  . IUD (intrauterine device) in place 08/03/2015  . Contact dermatitis 05/20/2014  . Elevated blood pressure reading without diagnosis of hypertension 05/20/2014  . Palpitations 08/11/2013  . Abdominal bruit 05/15/2013  . Migraine, unspecified, without mention of intractable migraine without mention of status migrainosus   . HSV (herpes simplex virus) infection   . Condyloma acuminatum   . Thyroid cancer ([place]) 03/23/2009    Current Outpatient Prescriptions on File Prior to Visit  Medication Sig Dispense Refill  . ARMOUR THYROID 60 MG tablet Take 1 tablet by mouth daily.  0  . cevimeline (EVOXAC) 30 MG capsule Take 30 mg by mouth 3 (three) times daily.    . Cholecalciferol (VITAMIN D PO) Take 1 tablet by mouth daily.     . metroNIDAZOLE (METROGEL VAGINAL) 0.75 % vaginal gel 1 applicator per vagina at HS x 5 70 g 0  . naproxen sodium (ANAPROX) 220 MG tablet Take 220 mg by mouth daily. Take every day    . valACYclovir (VALTREX) 500 MG tablet Take twice daily for 3-5 days as needed 30 tablet 12  . VYVANSE 40 MG capsule Take 1 tablet by mouth daily.  0   Current Facility-Administered Medications on File Prior to Visit  Medication Dose Route Frequency Provider Last Rate Last Dose  . levonorgestrel (MIRENA) 20 MCG/24HR IUD   Intrauterine Once Anastasio Auerbach, MD        No past medical history on file.  Past Surgical History:  Procedure Laterality Date  . APPENDECTOMY    . CESAREAN SECTION  01/2004  . CESAREAN SECTION  10/2005  . INTRAUTERINE DEVICE INSERTION  06/26/2015   Mirena  . LASIK    . THYROIDECTOMY  07/2009   FOR THYROID CANCER; Dr Vicie Mutters    Social History   Social History  .  Marital status: Divorced    Spouse name: N/A  . Number of children: N/A  . Years of education: N/A   Social History Main Topics  . Smoking status: Never Smoker  . Smokeless tobacco: Never Used  . Alcohol use Yes     Comment: SOCIALLY ONLY  . Drug use: No  . Sexual activity: No     Comment: Mirena IUD 07/23/2015   Other Topics Concern  . Not on file   Social History Narrative  . No narrative on file    Family History  Problem Relation Age of Onset  . Hypertension Mother   . Hypertension Maternal Grandmother   . CAD Maternal Grandmother     CBAG in 75s; pacer  . Hyperlipidemia Father   . Cancer Neg Hx   . Diabetes Neg Hx   . Stroke Neg Hx     Review of Systems     Objective:  There were no vitals filed for this visit. There were no vitals filed for this visit. There is no height or weight on file to calculate BMI.   Physical Exam        Assessment & Plan:       This encounter was created in error - please disregard.

## 2016-09-15 DIAGNOSIS — H524 Presbyopia: Secondary | ICD-10-CM | POA: Diagnosis not present

## 2016-10-17 DIAGNOSIS — M25511 Pain in right shoulder: Secondary | ICD-10-CM | POA: Diagnosis not present

## 2016-10-17 DIAGNOSIS — M546 Pain in thoracic spine: Secondary | ICD-10-CM | POA: Diagnosis not present

## 2016-10-17 DIAGNOSIS — M9901 Segmental and somatic dysfunction of cervical region: Secondary | ICD-10-CM | POA: Diagnosis not present

## 2016-10-17 DIAGNOSIS — M542 Cervicalgia: Secondary | ICD-10-CM | POA: Diagnosis not present

## 2016-10-20 DIAGNOSIS — M542 Cervicalgia: Secondary | ICD-10-CM | POA: Diagnosis not present

## 2016-10-20 DIAGNOSIS — M25511 Pain in right shoulder: Secondary | ICD-10-CM | POA: Diagnosis not present

## 2016-10-20 DIAGNOSIS — M546 Pain in thoracic spine: Secondary | ICD-10-CM | POA: Diagnosis not present

## 2016-10-20 DIAGNOSIS — M9901 Segmental and somatic dysfunction of cervical region: Secondary | ICD-10-CM | POA: Diagnosis not present

## 2016-11-14 DIAGNOSIS — H10212 Acute toxic conjunctivitis, left eye: Secondary | ICD-10-CM | POA: Diagnosis not present

## 2017-02-14 DIAGNOSIS — M9904 Segmental and somatic dysfunction of sacral region: Secondary | ICD-10-CM | POA: Diagnosis not present

## 2017-02-14 DIAGNOSIS — M25512 Pain in left shoulder: Secondary | ICD-10-CM | POA: Diagnosis not present

## 2017-02-14 DIAGNOSIS — M9902 Segmental and somatic dysfunction of thoracic region: Secondary | ICD-10-CM | POA: Diagnosis not present

## 2017-02-14 DIAGNOSIS — M546 Pain in thoracic spine: Secondary | ICD-10-CM | POA: Diagnosis not present

## 2017-02-19 ENCOUNTER — Other Ambulatory Visit: Payer: Self-pay | Admitting: Women's Health

## 2017-02-19 DIAGNOSIS — B009 Herpesviral infection, unspecified: Secondary | ICD-10-CM

## 2017-02-21 DIAGNOSIS — M546 Pain in thoracic spine: Secondary | ICD-10-CM | POA: Diagnosis not present

## 2017-02-21 DIAGNOSIS — M9904 Segmental and somatic dysfunction of sacral region: Secondary | ICD-10-CM | POA: Diagnosis not present

## 2017-02-21 DIAGNOSIS — M25512 Pain in left shoulder: Secondary | ICD-10-CM | POA: Diagnosis not present

## 2017-02-21 DIAGNOSIS — M9902 Segmental and somatic dysfunction of thoracic region: Secondary | ICD-10-CM | POA: Diagnosis not present

## 2017-04-19 ENCOUNTER — Other Ambulatory Visit: Payer: Self-pay | Admitting: Women's Health

## 2017-04-19 DIAGNOSIS — Z1231 Encounter for screening mammogram for malignant neoplasm of breast: Secondary | ICD-10-CM

## 2017-05-10 ENCOUNTER — Ambulatory Visit: Payer: BLUE CROSS/BLUE SHIELD

## 2017-05-29 ENCOUNTER — Emergency Department (HOSPITAL_COMMUNITY)
Admission: EM | Admit: 2017-05-29 | Discharge: 2017-05-29 | Disposition: A | Discharge status: Home / Self Care | Payer: BLUE CROSS/BLUE SHIELD | Attending: Emergency Medicine | Admitting: Emergency Medicine

## 2017-05-29 ENCOUNTER — Emergency Department (HOSPITAL_COMMUNITY): Payer: BLUE CROSS/BLUE SHIELD

## 2017-05-29 DIAGNOSIS — R000 Tachycardia, unspecified: Secondary | ICD-10-CM | POA: Diagnosis not present

## 2017-05-29 DIAGNOSIS — F909 Attention-deficit hyperactivity disorder, unspecified type: Secondary | ICD-10-CM | POA: Insufficient documentation

## 2017-05-29 DIAGNOSIS — E039 Hypothyroidism, unspecified: Secondary | ICD-10-CM | POA: Diagnosis not present

## 2017-05-29 DIAGNOSIS — R002 Palpitations: Secondary | ICD-10-CM | POA: Diagnosis not present

## 2017-05-29 DIAGNOSIS — I499 Cardiac arrhythmia, unspecified: Secondary | ICD-10-CM | POA: Diagnosis not present

## 2017-05-29 LAB — CBC
HCT: 37 % (ref 36.0–46.0)
Hemoglobin: 12.4 g/dL (ref 12.0–15.0)
MCH: 31.2 pg (ref 26.0–34.0)
MCHC: 33.5 g/dL (ref 30.0–36.0)
MCV: 93.2 fL (ref 78.0–100.0)
Platelets: 242 10*3/uL (ref 150–400)
RBC: 3.97 MIL/uL (ref 3.87–5.11)
RDW: 13.3 % (ref 11.5–15.5)
WBC: 6.7 10*3/uL (ref 4.0–10.5)

## 2017-05-29 LAB — BASIC METABOLIC PANEL
Anion gap: 10 (ref 5–15)
BUN: 8 mg/dL (ref 6–20)
CO2: 23 mmol/L (ref 22–32)
Calcium: 9.1 mg/dL (ref 8.9–10.3)
Chloride: 104 mmol/L (ref 101–111)
Creatinine, Ser: 0.62 mg/dL (ref 0.44–1.00)
GFR calc Af Amer: >60 mL/min (ref >60)
GFR calc non Af Amer: >60 mL/min (ref >60)
Glucose, Bld: 83 mg/dL (ref 65–99)
Potassium: 3.7 mmol/L (ref 3.5–5.1)
Sodium: 137 mmol/L (ref 135–145)

## 2017-05-29 LAB — I-STAT TROPONIN, ED: Troponin i, poc: 0 ng/mL (ref 0.00–0.08)

## 2017-05-29 LAB — T4, FREE: FREE T4: 0.73 ng/dL (ref 0.61–1.12)

## 2017-05-29 NOTE — ED Notes (Signed)
Pt is in stable condition upon d/c and ambulates from ED. 

## 2017-05-29 NOTE — Discharge Instructions (Signed)
Please reduce/eliminate caffeine from diet. Please followup with primary doctor for consideration of additional testing including Holter monitor and echocardiogram.

## 2017-05-29 NOTE — ED Provider Notes (Signed)
[facility] DEPT Provider Note   CSN: [id number] Arrival date & time: 05/29/17  1658     History   Chief Complaint Chief Complaint  Patient presents with  . Chest Pain  . Palpitations  . Near Syncope    HPI Jennifer Shepard is a 51 y.o. female.  The history is provided by the patient and medical records. No language interpreter was used.  Palpitations   This is a new problem. The current episode started 1 to 2 hours ago. The problem occurs rarely. The problem has been gradually improving. The problem is associated with caffeine. On average, each episode lasts 1 hour. Associated symptoms include near-syncope. Pertinent negatives include no fever, no chest pain, no abdominal pain, no vomiting, no back pain, no cough and no shortness of breath. Treatments tried: drinking water. The treatment provided moderate relief. There are no known risk factors.    History reviewed. No pertinent past medical history.  Patient Active Problem List   Diagnosis Date Noted  . Hypothyroidism 09/05/2016  . ADD (attention deficit disorder) 07/28/2016  . Right-sided nosebleed 06/02/2016  . IUD (intrauterine device) in place 08/03/2015  . Elevated blood pressure reading without diagnosis of hypertension 05/20/2014  . History of thyroid cancer 03/13/2014  . Palpitations 08/11/2013  . Abdominal bruit 05/15/2013  . Migraine, unspecified, without mention of intractable migraine without mention of status migrainosus   . HSV (herpes simplex virus) infection   . Condyloma acuminatum     Past Surgical History:  Procedure Laterality Date  . APPENDECTOMY    . CESAREAN SECTION  01/2004  . CESAREAN SECTION  10/2005  . INTRAUTERINE DEVICE INSERTION  06/26/2015   Mirena  . LASIK    . THYROIDECTOMY  07/2009   FOR THYROID CANCER; Dr Vicie Mutters    OB History    Durenda Age Para Term Preterm AB Living   4 2     2 2    SAB TAB Ectopic Multiple Live Births                   Home Medications    Prior to  Admission medications   Medication Sig Start Date End Date Taking? Authorizing Provider  ARMOUR THYROID 60 MG tablet Take 1 tablet by mouth daily. 08/26/15  Yes [provider]  cevimeline (EVOXAC) 30 MG capsule Take 30 mg by mouth 3 (three) times daily.   Yes [provider]  Cholecalciferol (VITAMIN D PO) Take 1 tablet by mouth daily.    Yes [provider]  naproxen sodium (ANAPROX) 220 MG tablet Take 220 mg by mouth as needed (headache/backache). Take every day   Yes [provider]  Probiotic Product (PROBIOTIC DAILY PO) Take 1 tablet by mouth daily.   Yes [provider]  tetrahydrozoline 0.05 % ophthalmic solution Place 2 drops into both eyes as needed (Dry eyes).   Yes [provider]  VYVANSE 40 MG capsule Take 1 tablet by mouth daily. 05/25/15  Yes [provider]  valACYclovir (VALTREX) 500 MG tablet TAKE 1 TABLET BY MOUTH TWICE DAILY FOR 3-5 DAYS AS NEEDED Patient not taking: Reported on 05/29/2017 02/19/17   Huel Cote, NP    Family History Family History  Problem Relation Age of Onset  . Hypertension Mother   . Hypertension Maternal Grandmother   . CAD Maternal Grandmother        CBAG in 31s; pacer  . Hyperlipidemia Father   . Cancer Neg Hx   . Diabetes Neg  Hx   . Stroke Neg Hx     Social History Social History  Substance Use Topics  . Smoking status: Never Smoker  . Smokeless tobacco: Never Used  . Alcohol use Yes     Comment: SOCIALLY ONLY     Allergies   Cefaclor and Oxycodone-acetaminophen   Review of Systems Review of Systems  Constitutional: Negative for chills and fever.  HENT: Negative for ear pain and sore throat.   Eyes: Negative for pain and visual disturbance.  Respiratory: Negative for cough and shortness of breath.   Cardiovascular: Positive for palpitations and near-syncope. Negative for chest pain.  Gastrointestinal: Negative for abdominal pain and vomiting.  Genitourinary:  Negative for dysuria and hematuria.  Musculoskeletal: Negative for arthralgias and back pain.  Skin: Negative for color change and rash.  Neurological: Negative for seizures and syncope.  All other systems reviewed and are negative.    Physical Exam Updated Vital Signs BP (!) 145/87   Pulse 93   Temp 98.4 F (36.9 C) (Oral)   Resp 14   Ht 5\' 9"  (1.753 m)   Wt 86.2 kg (190 lb)   SpO2 97%   BMI 28.06 kg/m   Physical Exam  Constitutional: She appears well-developed and well-nourished. No distress.  HENT:  Head: Normocephalic and atraumatic.  Eyes: Conjunctivae are normal.  Neck: Neck supple.  Cardiovascular: Regular rhythm.  Tachycardia present.   No murmur heard. Pulmonary/Chest: Effort normal and breath sounds normal. No respiratory distress.  Abdominal: Soft. There is no tenderness.  Musculoskeletal: She exhibits no edema.  Neurological: She is alert. No cranial nerve deficit. Coordination normal.  5/5 motor strength and intact sensation in all extremities. Finger-to-nose intact bilaterally  Skin: Skin is warm and dry.  Psychiatric: She has a normal mood and affect.  Nursing note and vitals reviewed.    ED Treatments / Results  Labs (all labs ordered are listed, but only abnormal results are displayed) Labs Reviewed  BASIC METABOLIC PANEL  CBC  T4, FREE  I-STAT TROPOININ, ED    EKG  EKG Interpretation  Date/Time:  Tuesday May 29 2017 17:11:44 EDT Ventricular Rate:  93 PR Interval:    QRS Duration: 96 QT Interval:  364 QTC Calculation: 453 R Axis:   29 Text Interpretation:  Sinus rhythm Non-specific ST-t changes No old tracing to compare Confirmed by [place]  MD, [place] [phone]) on 05/29/2017 5:46:34 PM       Radiology Dg Chest 2 View  Result Date: 05/29/2017 CLINICAL DATA:  Irregular heart beat EXAM: CHEST  2 VIEW COMPARISON:  None. FINDINGS: Surgical clips at the lower neck. No acute consolidation or pleural effusion. Normal cardiomediastinal  silhouette. No pneumothorax. IMPRESSION: No active cardiopulmonary disease. Electronically Signed   By: Donavan Foil M.D.   On: 05/29/2017 18:08    Procedures Procedures (including critical care time)  Medications Ordered in ED Medications - No data to display   Initial Impression / Assessment and Plan / ED Course  I have reviewed the triage vital signs and the nursing notes.  Pertinent labs & imaging results that were available during my care of the patient were reviewed by me and considered in my medical decision making (see chart for details).     51 year old female history of hypothyroidism who presents with acute onset of palpitations while at work today.  She reports feeling fast heart rate while sitting at her desk.  Shortly after, she felt decreasing vision bilaterally and nearly syncopized.  She reports drinking  water and symptoms seem to improve.  EMS was contacted and brought patient to ED.  She denies any significant stressors at home or work.  Occasionally drinks coffee 2-3 times per week.  Denies other caffeine usage.  She reports previous episode of tachycardia approximately 10 years ago, when she underwent extensive cardiac testing including negative TTE and stress echocardiogram.  She has remained asymptomatic up until today.  No family history of syncope or early unexplained deaths.  She denies any tobacco or drug use.  Has had history of thyroid cancer S/P thyroidectomy and currently takes thyroid medication.  Afebrile, VSS.  Lungs clear to auscultation bilaterally.  Abdomen soft benign throughout.  No focal neuro deficits.  EKG showing sinus rhythm and nonspecific ST-T changes.  CBC, BMP unremarkable.  Spot troponin undetectable.  CXR showing no acute cardiopulmonary abnormality.  Low risk Wells criteria with negative PERC.  Unlikely PE or ACS.  Occasional PVC noted on bedside cardiac monitor.  Patient endorses improved symptoms while in ED.  Suspect palpitations and  symptomatic tachycardia.  Patient stable for continued outpatient follow-up with consideration for Holter monitor and TTE.  Instructed patient to reduce/eliminate caffeine usage.  Return precautions provided for worsening symptoms. Pt will f/u with PCP at first availability. Pt verbalized agreement with plan.  Pt care d/w Dr. Wilson Singer  Final Clinical Impressions(s) / ED Diagnoses   Final diagnoses:  Palpitations  Tachycardia    New Prescriptions Discharge Medication List as of 05/29/2017  8:23 PM       Prosperity Darrough, Pilar Plate, MD 05/30/17 3893    Virgel Manifold, MD 06/04/17 1029

## 2017-05-29 NOTE — ED Triage Notes (Signed)
Patient comes in per [facility] with palpitations over the last couple days. Poss anxiety and near syncopal episode today. At rest HR 120. Hx thyroid Ca. PCP has been watching BP. Ems 170/90, 98% RA, 120 HR. EKG SR. cbg 71. No orthostatic changes. 20 LAC.

## 2017-05-29 NOTE — ED Notes (Signed)
Patient to xray.

## 2017-05-30 ENCOUNTER — Encounter (HOSPITAL_COMMUNITY): Payer: Self-pay | Admitting: Emergency Medicine

## 2017-06-06 DIAGNOSIS — R000 Tachycardia, unspecified: Secondary | ICD-10-CM | POA: Diagnosis not present

## 2017-06-06 DIAGNOSIS — R002 Palpitations: Secondary | ICD-10-CM | POA: Diagnosis not present

## 2017-07-05 DIAGNOSIS — C73 Malignant neoplasm of thyroid gland: Secondary | ICD-10-CM | POA: Diagnosis not present

## 2017-07-06 ENCOUNTER — Ambulatory Visit: Payer: BLUE CROSS/BLUE SHIELD

## 2017-07-09 ENCOUNTER — Ambulatory Visit: Payer: BLUE CROSS/BLUE SHIELD

## 2017-07-09 DIAGNOSIS — C73 Malignant neoplasm of thyroid gland: Secondary | ICD-10-CM | POA: Diagnosis not present

## 2017-07-17 ENCOUNTER — Ambulatory Visit: Payer: BLUE CROSS/BLUE SHIELD

## 2017-07-18 DIAGNOSIS — F988 Other specified behavioral and emotional disorders with onset usually occurring in childhood and adolescence: Secondary | ICD-10-CM | POA: Diagnosis not present

## 2017-07-18 DIAGNOSIS — Z79899 Other long term (current) drug therapy: Secondary | ICD-10-CM | POA: Diagnosis not present

## 2017-08-01 ENCOUNTER — Ambulatory Visit
Admission: RE | Admit: 2017-08-01 | Discharge: 2017-08-01 | Disposition: A | Discharge status: Home / Self Care | Payer: BLUE CROSS/BLUE SHIELD | Source: Ambulatory Visit | Attending: Women's Health | Admitting: Women's Health

## 2017-08-01 DIAGNOSIS — Z1231 Encounter for screening mammogram for malignant neoplasm of breast: Secondary | ICD-10-CM | POA: Diagnosis not present

## 2017-08-05 ENCOUNTER — Encounter: Payer: Self-pay | Admitting: Women's Health

## 2017-08-13 ENCOUNTER — Ambulatory Visit: Payer: BLUE CROSS/BLUE SHIELD | Admitting: Cardiovascular Disease

## 2017-08-30 ENCOUNTER — Encounter: Payer: BLUE CROSS/BLUE SHIELD | Admitting: Women's Health

## 2017-09-14 DIAGNOSIS — F4321 Adjustment disorder with depressed mood: Secondary | ICD-10-CM | POA: Diagnosis not present

## 2017-09-17 DIAGNOSIS — H04123 Dry eye syndrome of bilateral lacrimal glands: Secondary | ICD-10-CM | POA: Diagnosis not present

## 2017-09-19 ENCOUNTER — Encounter: Payer: BLUE CROSS/BLUE SHIELD | Admitting: Women's Health

## 2017-10-03 DIAGNOSIS — S61011A Laceration without foreign body of right thumb without damage to nail, initial encounter: Secondary | ICD-10-CM | POA: Diagnosis not present

## 2017-10-23 ENCOUNTER — Ambulatory Visit (INDEPENDENT_AMBULATORY_CARE_PROVIDER_SITE_OTHER): Payer: BLUE CROSS/BLUE SHIELD | Admitting: Women's Health

## 2017-10-23 ENCOUNTER — Encounter: Payer: Self-pay | Admitting: Women's Health

## 2017-10-23 VITALS — BP 142/82 | Ht 69.0 in | Wt 185.0 lb

## 2017-10-23 DIAGNOSIS — B009 Herpesviral infection, unspecified: Secondary | ICD-10-CM | POA: Diagnosis not present

## 2017-10-23 DIAGNOSIS — Z01419 Encounter for gynecological examination (general) (routine) without abnormal findings: Secondary | ICD-10-CM

## 2017-10-23 DIAGNOSIS — Z113 Encounter for screening for infections with a predominantly sexual mode of transmission: Secondary | ICD-10-CM | POA: Diagnosis not present

## 2017-10-23 MED ORDER — VALACYCLOVIR HCL 500 MG PO TABS
500.0000 mg | ORAL_TABLET | Freq: Two times a day (BID) | ORAL | 1 refills | Status: DC | PRN
Start: 2017-10-23 — End: 2018-11-25

## 2017-10-23 NOTE — Patient Instructions (Signed)
Colonoscopy  Dr Celine [facility]  [phone] Health Maintenance, Female Adopting a healthy lifestyle and getting preventive care can go a long way to promote health and wellness. Talk with your health care provider about what schedule of regular examinations is right for you. This is a good chance for you to check in with your provider about disease prevention and staying healthy. In between checkups, there are plenty of things you can do on your own. Experts have done a lot of research about which lifestyle changes and preventive measures are most likely to keep you healthy. Ask your health care provider for more information. Weight and diet Eat a healthy diet  Be sure to include plenty of vegetables, fruits, low-fat dairy products, and lean protein.  Do not eat a lot of foods high in solid fats, added sugars, or salt.  Get regular exercise. This is one of the most important things you can do for your health. ? Most adults should exercise for at least 150 minutes each week. The exercise should increase your heart rate and make you sweat (moderate-intensity exercise). ? Most adults should also do strengthening exercises at least twice a week. This is in addition to the moderate-intensity exercise.  Maintain a healthy weight  Body mass index (BMI) is a measurement that can be used to identify possible weight problems. It estimates body fat based on height and weight. Your health care provider can help determine your BMI and help you achieve or maintain a healthy weight.  For females 62 years of age and older: ? A BMI below 18.5 is considered underweight. ? A BMI of 18.5 to 24.9 is normal. ? A BMI of 25 to 29.9 is considered overweight. ? A BMI of 30 and above is considered obese.  Watch levels of cholesterol and blood lipids  You should start having your blood tested for lipids and cholesterol at 50 years of age, then have this test every 5 years.  You may need to have your cholesterol  levels checked more often if: ? Your lipid or cholesterol levels are high. ? You are older than 50 years of age. ? You are at high risk for heart disease.  Cancer screening Lung Cancer  Lung cancer screening is recommended for adults 65-51 years old who are at high risk for lung cancer because of a history of smoking.  A yearly low-dose CT scan of the lungs is recommended for people who: ? Currently smoke. ? Have quit within the past 15 years. ? Have at least a 30-pack-year history of smoking. A pack year is smoking an average of one pack of cigarettes a day for 1 year.  Yearly screening should continue until it has been 15 years since you quit.  Yearly screening should stop if you develop a health problem that would prevent you from having lung cancer treatment.  Breast Cancer  Practice breast self-awareness. This means understanding how your breasts normally appear and feel.  It also means doing regular breast self-exams. Let your health care provider know about any changes, no matter how small.  If you are in your 20s or 30s, you should have a clinical breast exam (CBE) by a health care provider every 1-3 years as part of a regular health exam.  If you are 31 or older, have a CBE every year. Also consider having a breast X-ray (mammogram) every year.  If you have a family history of breast cancer, talk to your health care provider about  genetic screening.  If you are at high risk for breast cancer, talk to your health care provider about having an MRI and a mammogram every year.  Breast cancer gene (BRCA) assessment is recommended for women who have family members with BRCA-related cancers. BRCA-related cancers include: ? Breast. ? Ovarian. ? Tubal. ? Peritoneal cancers.  Results of the assessment will determine the need for genetic counseling and BRCA1 and BRCA2 testing.  Cervical Cancer Your health care provider may recommend that you be screened regularly for cancer of  the pelvic organs (ovaries, uterus, and vagina). This screening involves a pelvic examination, including checking for microscopic changes to the surface of your cervix (Pap test). You may be encouraged to have this screening done every 3 years, beginning at age 98.  For women ages 40-65, health care providers may recommend pelvic exams and Pap testing every 3 years, or they may recommend the Pap and pelvic exam, combined with testing for human papilloma virus (HPV), every 5 years. Some types of HPV increase your risk of cervical cancer. Testing for HPV may also be done on women of any age with unclear Pap test results.  Other health care providers may not recommend any screening for nonpregnant women who are considered low risk for pelvic cancer and who do not have symptoms. Ask your health care provider if a screening pelvic exam is right for you.  If you have had past treatment for cervical cancer or a condition that could lead to cancer, you need Pap tests and screening for cancer for at least 20 years after your treatment. If Pap tests have been discontinued, your risk factors (such as having a new sexual partner) need to be reassessed to determine if screening should resume. Some women have medical problems that increase the chance of getting cervical cancer. In these cases, your health care provider may recommend more frequent screening and Pap tests.  Colorectal Cancer  This type of cancer can be detected and often prevented.  Routine colorectal cancer screening usually begins at 50 years of age and continues through 50 years of age.  Your health care provider may recommend screening at an earlier age if you have risk factors for colon cancer.  Your health care provider may also recommend using home test kits to check for hidden blood in the stool.  A small camera at the end of a tube can be used to examine your colon directly (sigmoidoscopy or colonoscopy). This is done to check for the  earliest forms of colorectal cancer.  Routine screening usually begins at age 28.  Direct examination of the colon should be repeated every 5-10 years through 50 years of age. However, you may need to be screened more often if early forms of precancerous polyps or small growths are found.  Skin Cancer  Check your skin from head to toe regularly.  Tell your health care provider about any new moles or changes in moles, especially if there is a change in a mole's shape or color.  Also tell your health care provider if you have a mole that is larger than the size of a pencil eraser.  Always use sunscreen. Apply sunscreen liberally and repeatedly throughout the day.  Protect yourself by wearing long sleeves, pants, a wide-brimmed hat, and sunglasses whenever you are outside.  Heart disease, diabetes, and high blood pressure  High blood pressure causes heart disease and increases the risk of stroke. High blood pressure is more likely to develop in: ? People  who have blood pressure in the high end of the normal range (130-139/85-89 mm Hg). ? People who are overweight or obese. ? People who are African American.  If you are 5-22 years of age, have your blood pressure checked every 3-5 years. If you are 69 years of age or older, have your blood pressure checked every year. You should have your blood pressure measured twice-once when you are at a hospital or clinic, and once when you are not at a hospital or clinic. Record the average of the two measurements. To check your blood pressure when you are not at a hospital or clinic, you can use: ? An automated blood pressure machine at a pharmacy. ? A home blood pressure monitor.  If you are between 29 years and 27 years old, ask your health care provider if you should take aspirin to prevent strokes.  Have regular diabetes screenings. This involves taking a blood sample to check your fasting blood sugar level. ? If you are at a normal weight and  have a low risk for diabetes, have this test once every three years after 50 years of age. ? If you are overweight and have a high risk for diabetes, consider being tested at a younger age or more often. Preventing infection Hepatitis B  If you have a higher risk for hepatitis B, you should be screened for this virus. You are considered at high risk for hepatitis B if: ? You were born in a country where hepatitis B is common. Ask your health care provider which countries are considered high risk. ? Your parents were born in a high-risk country, and you have not been immunized against hepatitis B (hepatitis B vaccine). ? You have HIV or AIDS. ? You use needles to inject street drugs. ? You live with someone who has hepatitis B. ? You have had sex with someone who has hepatitis B. ? You get hemodialysis treatment. ? You take certain medicines for conditions, including cancer, organ transplantation, and autoimmune conditions.  Hepatitis C  Blood testing is recommended for: ? Everyone born from [birth year] through 1964. ? Anyone with known risk factors for hepatitis C.  Sexually transmitted infections (STIs)  You should be screened for sexually transmitted infections (STIs) including gonorrhea and chlamydia if: ? You are sexually active and are younger than 50 years of age. ? You are older than 50 years of age and your health care provider tells you that you are at risk for this type of infection. ? Your sexual activity has changed since you were last screened and you are at an increased risk for chlamydia or gonorrhea. Ask your health care provider if you are at risk.  If you do not have HIV, but are at risk, it may be recommended that you take a prescription medicine daily to prevent HIV infection. This is called pre-exposure prophylaxis (PrEP). You are considered at risk if: ? You are sexually active and do not regularly use condoms or know the HIV status of your partner(s). ? You take drugs by  injection. ? You are sexually active with a partner who has HIV.  Talk with your health care provider about whether you are at high risk of being infected with HIV. If you choose to begin PrEP, you should first be tested for HIV. You should then be tested every 3 months for as long as you are taking PrEP. Pregnancy  If you are premenopausal and you may become pregnant, ask your health care  provider about preconception counseling.  If you may become pregnant, take 400 to 800 micrograms (mcg) of folic acid every day.  If you want to prevent pregnancy, talk to your health care provider about birth control (contraception). Osteoporosis and menopause  Osteoporosis is a disease in which the bones lose minerals and strength with aging. This can result in serious bone fractures. Your risk for osteoporosis can be identified using a bone density scan.  If you are 35 years of age or older, or if you are at risk for osteoporosis and fractures, ask your health care provider if you should be screened.  Ask your health care provider whether you should take a calcium or vitamin D supplement to lower your risk for osteoporosis.  Menopause may have certain physical symptoms and risks.  Hormone replacement therapy may reduce some of these symptoms and risks. Talk to your health care provider about whether hormone replacement therapy is right for you. Follow these instructions at home:  Schedule regular health, dental, and eye exams.  Stay current with your immunizations.  Do not use any tobacco products including cigarettes, chewing tobacco, or electronic cigarettes.  If you are pregnant, do not drink alcohol.  If you are breastfeeding, limit how much and how often you drink alcohol.  Limit alcohol intake to no more than 1 drink per day for nonpregnant women. One drink equals 12 ounces of beer, 5 ounces of wine, or 1 ounces of hard liquor.  Do not use street drugs.  Do not share needles.  Ask  your health care provider for help if you need support or information about quitting drugs.  Tell your health care provider if you often feel depressed.  Tell your health care provider if you have ever been abused or do not feel safe at home. This information is not intended to replace advice given to you by your health care provider. Make sure you discuss any questions you have with your health care provider. Document Released: 06/26/2011 Document Revised: 05/18/2016 Document Reviewed: 09/14/2015 Elsevier Interactive Patient Education  [employer].

## 2017-10-23 NOTE — Progress Notes (Signed)
Jennifer Shepard 20-Mar-1966 [id number]    History:    Presents for annual exam.  07/2015 Mirena IUD rare spotting. Normal Pap and mammogram history. 2010 thyroid cancer thyroidectomy hypothyroid endocrinologist manages. HSV rare outbreaks. No menopausal symptoms. New partner.  Past medical history, past surgical history, family history and social history were all reviewed and documented in the EPIC chart. Nicole 13, Mason 10 both doing well. 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 Mother hypertension.  ROS:  A ROS was performed and pertinent positives and negatives are included.  Exam:  Vitals:   10/23/17 1621  BP: (!) 142/82  Weight: 185 lb (83.9 kg)  Height: 5\' 9"  (1.753 m)   Body mass index is 27.32 kg/m.   General appearance:  Normal Thyroid:  Symmetrical, normal in size, without palpable masses or nodularity. Respiratory  Auscultation:  Clear without wheezing or rhonchi Cardiovascular  Auscultation:  Regular rate, without rubs, murmurs or gallops  Edema/varicosities:  Not grossly evident Abdominal  Soft,nontender, without masses, guarding or rebound.  Liver/spleen:  No organomegaly noted  Hernia:  None appreciated  Skin  Inspection:  Grossly normal   Breasts: Examined lying and sitting.     Right: Without masses, retractions, discharge or axillary adenopathy.     Left: Without masses, retractions, discharge or axillary adenopathy. Gentitourinary   Inguinal/mons:  Normal without inguinal adenopathy  External genitalia:  Normal  BUS/Urethra/Skene's glands:  Normal  Vagina:  Normal  Cervix:  Normal IUD strings visible  Uterus:   normal in size, shape and contour.  Midline and mobile  Adnexa/parametria:     Rt: Without masses or tenderness.   Lt: Without masses or tenderness.  Anus and perineum: Normal  Digital rectal exam: Normal sphincter tone without palpated masses or tenderness  Assessment/Plan:  51 y.o. DWF T4 P2 for annual exam with no complaints.  07/2015 Mirena  IUD. Rare Bleeding STD screen Hypothyroid-endocrinologist manages HSV 1 rare outbreaks  Plan: Aware blood pressure slightly elevated instructed to check away from office if he continues greater than 130/80 follow-up with primary care. Valtrex 500 twice daily for 3-5 days as needed, prescription, proper use given and reviewed. SBE's, continue annual screening mammogram, calcium rich diet, vitamin D 2000 daily encouraged. Screening colonoscopy, [facility] information given instructed to schedule. CBC, CMP, GC/Chlamydia, HIV, hep B, C, RPR. Instructed to come fasting next year will check lipid panel, normal in the past. Pap normal 2016, new screening guidelines reviewed.    [place], 4:56 PM 10/23/2017

## 2017-10-24 ENCOUNTER — Encounter: Payer: Self-pay | Admitting: Women's Health

## 2017-10-24 LAB — COMPREHENSIVE METABOLIC PANEL
AG Ratio: 1.8 (calc) (ref 1.0–2.5)
ALBUMIN MSPROF: 4.4 g/dL (ref 3.6–5.1)
ALKALINE PHOSPHATASE (APISO): 49 U/L (ref 33–130)
ALT: 14 U/L (ref 6–29)
AST: 14 U/L (ref 10–35)
BILIRUBIN TOTAL: 0.6 mg/dL (ref 0.2–1.2)
BUN: 11 mg/dL (ref 7–25)
CALCIUM: 9.5 mg/dL (ref 8.6–10.4)
CHLORIDE: 102 mmol/L (ref 98–110)
CO2: 27 mmol/L (ref 20–32)
Creat: 0.79 mg/dL (ref 0.50–1.05)
GLOBULIN: 2.4 g/dL (ref 1.9–3.7)
Glucose, Bld: 91 mg/dL (ref 65–99)
POTASSIUM: 4.2 mmol/L (ref 3.5–5.3)
Sodium: 138 mmol/L (ref 135–146)
Total Protein: 6.8 g/dL (ref 6.1–8.1)

## 2017-10-24 LAB — CBC WITH DIFFERENTIAL/PLATELET
Basophils Absolute: 48 cells/uL (ref 0–200)
Basophils Relative: 0.8 %
EOS ABS: 18 {cells}/uL (ref 15–500)
Eosinophils Relative: 0.3 %
HEMATOCRIT: 37.9 % (ref 35.0–45.0)
Hemoglobin: 13.1 g/dL (ref 11.7–15.5)
LYMPHS ABS: 1452 {cells}/uL (ref 850–3900)
MCH: 32 pg (ref 27.0–33.0)
MCHC: 34.6 g/dL (ref 32.0–36.0)
MCV: 92.7 fL (ref 80.0–100.0)
MPV: 10.6 fL (ref 7.5–12.5)
Monocytes Relative: 9.1 %
NEUTROS PCT: 65.6 %
Neutro Abs: 3936 cells/uL (ref 1500–7800)
Platelets: 262 10*3/uL (ref 140–400)
RBC: 4.09 10*6/uL (ref 3.80–5.10)
RDW: 11.8 % (ref 11.0–15.0)
Total Lymphocyte: 24.2 %
WBC: 6 10*3/uL (ref 3.8–10.8)
WBCMIX: 546 {cells}/uL (ref 200–950)

## 2017-10-24 LAB — URINALYSIS W MICROSCOPIC + REFLEX CULTURE
BACTERIA UA: NONE SEEN /HPF
Bilirubin Urine: NEGATIVE
Glucose, UA: NEGATIVE
HGB URINE DIPSTICK: NEGATIVE
HYALINE CAST: NONE SEEN /LPF
KETONES UR: NEGATIVE
Leukocyte Esterase: NEGATIVE
Nitrites, Initial: NEGATIVE
PH: 7.5 (ref 5.0–8.0)
Protein, ur: NEGATIVE
RBC / HPF: NONE SEEN /HPF (ref 0–2)
SPECIFIC GRAVITY, URINE: 1.009 (ref 1.001–1.03)
WBC UA: NONE SEEN /HPF (ref 0–5)

## 2017-10-24 LAB — HEPATITIS C ANTIBODY
HEP C AB: NONREACTIVE
SIGNAL TO CUT-OFF: 0.01 (ref <1.00)

## 2017-10-24 LAB — RPR: RPR Ser Ql: NONREACTIVE

## 2017-10-24 LAB — HEPATITIS B SURFACE ANTIGEN: HEP B S AG: NONREACTIVE

## 2017-10-24 LAB — C. TRACHOMATIS/N. GONORRHOEAE RNA
C. TRACHOMATIS RNA, TMA: NOT DETECTED
N. gonorrhoeae RNA, TMA: NOT DETECTED

## 2017-10-24 LAB — NO CULTURE INDICATED

## 2017-10-24 LAB — HIV ANTIBODY (ROUTINE TESTING W REFLEX): HIV 1&2 Ab, 4th Generation: NONREACTIVE

## 2018-01-31 ENCOUNTER — Ambulatory Visit: Payer: BLUE CROSS/BLUE SHIELD | Admitting: Family Medicine

## 2018-02-04 ENCOUNTER — Encounter: Payer: Self-pay | Admitting: Family Medicine

## 2018-02-04 ENCOUNTER — Ambulatory Visit: Payer: BLUE CROSS/BLUE SHIELD | Admitting: Family Medicine

## 2018-02-04 VITALS — BP 130/80 | HR 89 | Temp 97.5°F | Wt 190.1 lb

## 2018-02-04 DIAGNOSIS — S09301A Unspecified injury of right middle and inner ear, initial encounter: Secondary | ICD-10-CM

## 2018-02-04 NOTE — Progress Notes (Signed)
Subjective:     Patient ID: Jennifer Shepard, female   DOB: 21-Jan-1966, 52 y.o.   MRN: [id number]  HPI Patient seen as a work in as a new patient with "right ear fullness ". She states last Wednesday she was out of shower and had a Q-tip in her ear and someone came in today after startling her,  the Q-tip was jammed into her ear and she had some sharp pain. No bleeding. No drainage. She feels her hearing has been slightly off since then. No fevers or chills.  No past medical history on file. Past Surgical History:  Procedure Laterality Date  . APPENDECTOMY    . BREAST CYST ASPIRATION Right pt unsure   no visible scar  . CESAREAN SECTION  01/2004  . CESAREAN SECTION  10/2005  . INTRAUTERINE DEVICE INSERTION  06/26/2015   Mirena  . LASIK    . THYROIDECTOMY  07/2009   FOR THYROID CANCER; Dr Vicie Mutters    reports that  has never smoked. she has never used smokeless tobacco. She reports that she drinks alcohol. She reports that she does not use drugs. family history includes CAD in her maternal grandmother; Hyperlipidemia in her father; Hypertension in her maternal grandmother and mother. Allergies  Allergen Reactions  . Cefaclor Other (See Comments)    Angioedema of lips  . Morphine And Related Other (See Comments)    Severe headache  . Oxycodone-Acetaminophen Itching    itching     Review of Systems  Constitutional: Negative for chills and fever.  HENT: Positive for ear pain. Negative for congestion, ear discharge and tinnitus.   Neurological: Negative for headaches.       Objective:   Physical Exam  Constitutional: She appears well-developed and well-nourished.  HENT:  Left eardrum is normal. Right eardrum reveals small area of abrasion and possible small perforation along the posterior aspect of the eardrum. No active bleeding.  Cardiovascular: Regular rhythm.  Pulmonary/Chest: Effort normal and breath sounds normal. No respiratory distress. She has no wheezes. She has no rales.        Assessment:     Right eardrum trauma with Q-tip last week. No signs of secondary infection. Suspect this will heal completely over time    Plan:     -Keep ear dry as possible. -Recommend she get this looked at with the next couple weeks to document full healing. -Patient had planned to establish at [facility] site. She is requesting now whether she can reestablish here-she states this is closer to her work.  We explained that is her option  Eulas Post MD [facility] at [facility] .

## 2018-02-04 NOTE — Patient Instructions (Signed)
Keep ear dry as possible Set up follow up for pt- OK to set up establish care here in 2 weeks.

## 2018-02-11 ENCOUNTER — Ambulatory Visit: Payer: BLUE CROSS/BLUE SHIELD | Admitting: Family Medicine

## 2018-02-19 ENCOUNTER — Ambulatory Visit: Payer: BLUE CROSS/BLUE SHIELD | Admitting: Family Medicine

## 2018-02-26 ENCOUNTER — Ambulatory Visit: Payer: BLUE CROSS/BLUE SHIELD | Admitting: Family Medicine

## 2018-02-26 ENCOUNTER — Encounter: Payer: Self-pay | Admitting: Family Medicine

## 2018-02-26 VITALS — BP 140/90 | HR 106 | Temp 97.8°F | Ht 69.25 in | Wt 188.0 lb

## 2018-02-26 DIAGNOSIS — H109 Unspecified conjunctivitis: Secondary | ICD-10-CM

## 2018-02-26 DIAGNOSIS — R030 Elevated blood-pressure reading, without diagnosis of hypertension: Secondary | ICD-10-CM | POA: Diagnosis not present

## 2018-02-26 DIAGNOSIS — S09301D Unspecified injury of right middle and inner ear, subsequent encounter: Secondary | ICD-10-CM | POA: Diagnosis not present

## 2018-02-26 DIAGNOSIS — Z23 Encounter for immunization: Secondary | ICD-10-CM

## 2018-02-26 DIAGNOSIS — L309 Dermatitis, unspecified: Secondary | ICD-10-CM | POA: Diagnosis not present

## 2018-02-26 DIAGNOSIS — L821 Other seborrheic keratosis: Secondary | ICD-10-CM | POA: Diagnosis not present

## 2018-02-26 DIAGNOSIS — L814 Other melanin hyperpigmentation: Secondary | ICD-10-CM | POA: Diagnosis not present

## 2018-02-26 DIAGNOSIS — Z1211 Encounter for screening for malignant neoplasm of colon: Secondary | ICD-10-CM | POA: Diagnosis not present

## 2018-02-26 DIAGNOSIS — D1801 Hemangioma of skin and subcutaneous tissue: Secondary | ICD-10-CM | POA: Diagnosis not present

## 2018-02-26 MED ORDER — POLYMYXIN B-TRIMETHOPRIM 10000-0.1 UNIT/ML-% OP SOLN: 2.0000 [drp] | OPHTHALMIC | 0 refills | Status: DC

## 2018-02-26 NOTE — Progress Notes (Signed)
Subjective:     Patient ID: Jennifer Shepard, female   DOB: 01-20-1966, 52 y.o.   MRN: [id number]  HPI Patient seen for follow-up regarding multiple items as follows  Recent right eardrum trauma from Q-tip. Has no pain at this time. No dizziness. No drainage.  2 day history of some left eye irritation and itching. She's had a little bit of crusted discharge. No blurred vision.  No contact use.  History of borderline elevated blood pressure in the past. Does not take any medications. She has home monitor but has not been checking recently. She had blood pressure 130/80 last visit and slightly higher today. Has not been exercising much recently.  Patient relates past history of palpitations. These occur somewhat infrequently. No history of syncope. No chest pain. There had been discussion of cardiology referral (from previous provider) but her appointment with cardiology had been canceled in she's not rescheduled. She does have history of thyroid cancer 2010 and has had thyroidectomy and is on thyroid replacement. She is followed by endocrinology for that.  . Attention deficit disorder on Vyvanse. She is requesting that we take over prescribing that. Previously through [employer]  Patient turned 51 this year and has never had colonoscopy screening  No past medical history on file. Past Surgical History:  Procedure Laterality Date  . APPENDECTOMY    . BREAST CYST ASPIRATION Right pt unsure   no visible scar  . CESAREAN SECTION  01/2004  . CESAREAN SECTION  10/2005  . INTRAUTERINE DEVICE INSERTION  06/26/2015   Mirena  . LASIK    . THYROIDECTOMY  07/2009   FOR THYROID CANCER; Dr Vicie Mutters    reports that  has never smoked. she has never used smokeless tobacco. She reports that she drinks alcohol. She reports that she does not use drugs. family history includes CAD in her maternal grandmother; Hyperlipidemia in her father; Hypertension in her maternal grandmother and mother. Allergies   Allergen Reactions  . Cefaclor Other (See Comments)    Angioedema of lips  . Morphine And Related Other (See Comments)    Severe headache  . Oxycodone-Acetaminophen Itching    itching     Review of Systems  Constitutional: Negative for fatigue.  Eyes: Positive for discharge and itching. Negative for pain and visual disturbance.  Respiratory: Negative for cough, chest tightness, shortness of breath and wheezing.   Cardiovascular: Positive for palpitations. Negative for chest pain and leg swelling.  Neurological: Negative for dizziness, seizures, syncope, weakness, light-headedness and headaches.       Objective:   Physical Exam  Constitutional: She appears well-developed and well-nourished.  HENT:  Right ear drum reveals minimal scabbed area and dried blood posterior eardrum region. No visible perforation. Remainder of eardrum appears normal  Eyes: Pupils are equal, round, and reactive to light.  Left conjunctiva slightly erythematous compared to the right. No purulent drainage.  Neck: Neck supple. No JVD present. No thyromegaly present.  Cardiovascular: Normal rate and regular rhythm. Exam reveals no gallop.  Pulmonary/Chest: Effort normal and breath sounds normal. No respiratory distress. She has no wheezes. She has no rales.  Musculoskeletal: She exhibits no edema.  Neurological: She is alert.       Assessment:     #1 recent mild right eardrum trauma which appears to be healing well without complication  #2 probable mild left eye conjunctivitis  #3 mildly elevated blood pressure without diagnosis of hypertension  #4 history of palpitations which are very infrequent. Suspect symptomatic PVCs  or PACs.  #5 in need  of colon cancer screening  #6 remote history of thyroidectomy secondary to thyroid cancer. She has hypothyroidism and is followed by specialist at [place]:     -Polytrim ophthalmic drops 2 drops left eye every 4 hours while awake and also  warm compresses a few times daily -Reassurance regarding right ear -Set up screening colonoscopy -Discussed potential triggers for PVCs such as caffeine, alcohol, stress. Follow-up for any worsening palpitations or any new symptoms -Monitor blood pressure home and be in touch if consistently greater than 140/90 -pt also requesting flu vaccine-.  Even though late in season went ahead and gave as still seeing some flu.  Eulas Post MD [facility] at [facility]

## 2018-02-26 NOTE — Patient Instructions (Signed)
Monitor blood pressure and be in touch if consistently > 140/90.   

## 2018-03-14 ENCOUNTER — Other Ambulatory Visit: Payer: Self-pay | Admitting: Family Medicine

## 2018-03-14 NOTE — Telephone Encounter (Signed)
Copied from [place] [id number]. Topic: Quick Communication - Rx Refill/Question >> Mar 14, 2018  7:48 AM Synthia Innocent wrote: Medication: VYVANSE 40 MG capsule, 90 day supply Has the patient contacted their pharmacy? Yes.   (Agent: If no, request that the patient contact the pharmacy for the refill.) Preferred Pharmacy (with phone number or street name): [facility] on [address]: Please be advised that RX refills may take up to 3 business days. We ask that you follow-up with your pharmacy.

## 2018-03-14 NOTE — Telephone Encounter (Signed)
Patient is calling and wanted to know why her RX was not sent in yet. I informed her there is a 3 business day turn around time, to please try and call sooner when she is almost out. She states she did not know that and that she will be out as of tomorrow. Patient states she is a new patient and was told since its Vyvanse it can not be called in sooner than a day in advance. Please advise.

## 2018-03-14 NOTE — Telephone Encounter (Signed)
vyvanse LOV: 02/26/18 but not for ADD  PCP: Dr  Carolann Littler Pharmacy: [address] [place], [place]

## 2018-03-15 MED ORDER — LISDEXAMFETAMINE DIMESYLATE 40 MG PO CAPS: 40.0000 mg | ORAL_CAPSULE | ORAL | 0 refills | Status: DC

## 2018-03-15 MED ORDER — LISDEXAMFETAMINE DIMESYLATE 40 MG PO CAPS: 40.0000 mg | ORAL_CAPSULE | Freq: Every day | ORAL | 0 refills | Status: DC

## 2018-03-15 NOTE — Telephone Encounter (Signed)
rx ready for pick up and patient is aware  

## 2018-03-15 NOTE — Telephone Encounter (Signed)
Refill for 3 months. 

## 2018-04-08 ENCOUNTER — Other Ambulatory Visit: Payer: Self-pay | Admitting: Family Medicine

## 2018-04-08 NOTE — Telephone Encounter (Signed)
Copied from [place] [phone]. Topic: Quick Communication - Rx Refill/Question >> Apr 08, 2018  8:00 AM Aurelio Brash B wrote: Medication:lisdexamfetamine (VYVANSE) 40 MG capsule    pt wants 90 day supply     Pt would like a call  When this  complete   Has the patient contacted their pharmacy?no   (Agent: If no, request that the patient contact the pharmacy for the refill.)  Preferred Pharmacy (with phone number or street name): [facility] [place], [place] AT [place] [phone] (Phone) [phone] (Fax)      Agent: Please be advised that RX refills may take up to 3 business days. We ask that you follow-up with your pharmacy.

## 2018-04-08 NOTE — Telephone Encounter (Signed)
Rx refill request: 90 day supply     Vyvanse 40 mg  LOV: 02/26/18 (acute)  PCP: [facility]  Pharmacy: verified

## 2018-04-09 ENCOUNTER — Telehealth: Payer: Self-pay | Admitting: Family Medicine

## 2018-04-09 NOTE — Telephone Encounter (Signed)
Last refill 03/15/18 and last office 02/26/18.  Okay to fill?

## 2018-04-09 NOTE — Telephone Encounter (Signed)
Refill for 90 days

## 2018-04-09 NOTE — Telephone Encounter (Signed)
Copied from [place] [phone]. Topic: Quick Communication - Rx Refill/Question >> Apr 08, 2018  8:00 AM Aurelio Brash B wrote: Medication:lisdexamfetamine (VYVANSE) 40 MG capsule    pt wants 90 day supply   Has the patient contacted their pharmacy?no   (Agent: If no, request that the patient contact the pharmacy for the refill.)  Preferred Pharmacy (with phone number or street name): [facility] [place], [place] AT [place] [phone] (Phone) [phone] (Fax)      Agent: Please be advised that RX refills may take up to 3 business days. We ask that you follow-up with your pharmacy. >> Apr 09, 2018  3:14 PM Ahmed Prima L wrote: Patient said she did not get a 3 month supply. She was only giving 30 days. [place]. Please advise. She only has enough through Tuesday.

## 2018-04-10 MED ORDER — LISDEXAMFETAMINE DIMESYLATE 40 MG PO CAPS: 40.0000 mg | ORAL_CAPSULE | Freq: Every day | ORAL | 0 refills | Status: DC

## 2018-04-10 MED ORDER — LISDEXAMFETAMINE DIMESYLATE 40 MG PO CAPS: 40.0000 mg | ORAL_CAPSULE | ORAL | 0 refills | Status: DC

## 2018-04-10 NOTE — Telephone Encounter (Signed)
rx ready for pick up and patient is aware  

## 2018-04-29 ENCOUNTER — Encounter: Payer: Self-pay | Admitting: Family Medicine

## 2018-07-15 ENCOUNTER — Other Ambulatory Visit: Payer: Self-pay | Admitting: Family Medicine

## 2018-07-15 NOTE — Telephone Encounter (Signed)
Copied from [place] [id number]. Topic: Quick Communication - Rx Refill/Question >> Jul 15, 2018 10:51 AM Yvette Rack wrote: Medication  [facility] [phone] - [place], [place] AT [place] [phone] (Phone) [phone] (Fax)  Has the patient contacted their pharmacy? No.  Tranfered to pharmacy  pt states that they write her a RX and she goes over to pharmacy to fill it (Agent: If no, request that the patient contact the pharmacy for the refill.) (Agent: If yes, when and what did the pharmacy advise?)  Preferred Pharmacy (with phone number or street name):   [facility] [id number] - [place], [place] - [place] AT [place] [phone] (Phone) [phone] (Fax)      Agent: Please be advised that RX refills may take up to 3 business days. We ask that you follow-up with your pharmacy.

## 2018-07-15 NOTE — Telephone Encounter (Signed)
Patient is requesting to be sent electronically and requesting call once done

## 2018-07-16 MED ORDER — LISDEXAMFETAMINE DIMESYLATE 40 MG PO CAPS: 40.0000 mg | ORAL_CAPSULE | Freq: Every day | ORAL | 0 refills | Status: DC

## 2018-07-16 MED ORDER — LISDEXAMFETAMINE DIMESYLATE 40 MG PO CAPS: 40.0000 mg | ORAL_CAPSULE | ORAL | 0 refills | Status: DC

## 2018-07-16 NOTE — Telephone Encounter (Signed)
Rx refill request: Vyvanse 40 mg         Last filled 04/10/18 ( patient gets 3 Rx) Patient is requesting Rx sent electronically to pharmacy  LOV: 02/26/18  PCP: [place]: verified

## 2018-07-16 NOTE — Telephone Encounter (Signed)
Refills sent electronically

## 2018-07-16 NOTE — Telephone Encounter (Signed)
Sent to PCP to advise 

## 2018-08-01 DIAGNOSIS — C73 Malignant neoplasm of thyroid gland: Secondary | ICD-10-CM | POA: Diagnosis not present

## 2018-08-02 DIAGNOSIS — F4321 Adjustment disorder with depressed mood: Secondary | ICD-10-CM | POA: Diagnosis not present

## 2018-08-05 DIAGNOSIS — C73 Malignant neoplasm of thyroid gland: Secondary | ICD-10-CM | POA: Diagnosis not present

## 2018-08-08 DIAGNOSIS — F4321 Adjustment disorder with depressed mood: Secondary | ICD-10-CM | POA: Diagnosis not present

## 2018-08-13 DIAGNOSIS — F4321 Adjustment disorder with depressed mood: Secondary | ICD-10-CM | POA: Diagnosis not present

## 2018-08-15 DIAGNOSIS — F4321 Adjustment disorder with depressed mood: Secondary | ICD-10-CM | POA: Diagnosis not present

## 2018-08-21 DIAGNOSIS — F4321 Adjustment disorder with depressed mood: Secondary | ICD-10-CM | POA: Diagnosis not present

## 2018-08-27 ENCOUNTER — Ambulatory Visit: Payer: BLUE CROSS/BLUE SHIELD | Admitting: Adult Health

## 2018-08-27 ENCOUNTER — Encounter: Payer: Self-pay | Admitting: Adult Health

## 2018-08-27 ENCOUNTER — Encounter: Payer: Self-pay | Admitting: Family Medicine

## 2018-08-27 VITALS — BP 138/90 | Temp 98.4°F | Wt 177.0 lb

## 2018-08-27 DIAGNOSIS — R6883 Chills (without fever): Secondary | ICD-10-CM | POA: Diagnosis not present

## 2018-08-27 DIAGNOSIS — R52 Pain, unspecified: Secondary | ICD-10-CM

## 2018-08-27 LAB — HEPATIC FUNCTION PANEL
ALBUMIN: 4.2 g/dL (ref 3.5–5.2)
ALT: 30 U/L (ref 0–35)
AST: 34 U/L (ref 0–37)
Alkaline Phosphatase: 71 U/L (ref 39–117)
Bilirubin, Direct: 0.1 mg/dL (ref 0.0–0.3)
Total Bilirubin: 0.8 mg/dL (ref 0.2–1.2)
Total Protein: 6.9 g/dL (ref 6.0–8.3)

## 2018-08-27 LAB — CBC WITH DIFFERENTIAL/PLATELET
Basophils Absolute: 0.1 10*3/uL (ref 0.0–0.1)
Basophils Relative: 1.2 % (ref 0.0–3.0)
Eosinophils Absolute: 0 10*3/uL (ref 0.0–0.7)
Eosinophils Relative: 0.6 % (ref 0.0–5.0)
HEMATOCRIT: 39.3 % (ref 36.0–46.0)
HEMOGLOBIN: 13.7 g/dL (ref 12.0–15.0)
LYMPHS PCT: 34.7 % (ref 12.0–46.0)
Lymphs Abs: 1.6 10*3/uL (ref 0.7–4.0)
MCHC: 34.8 g/dL (ref 30.0–36.0)
MCV: 90.6 fl (ref 78.0–100.0)
MONOS PCT: 12 % (ref 3.0–12.0)
Monocytes Absolute: 0.6 10*3/uL (ref 0.1–1.0)
NEUTROS ABS: 2.4 10*3/uL (ref 1.4–7.7)
Neutrophils Relative %: 51.5 % (ref 43.0–77.0)
Platelets: 213 10*3/uL (ref 150.0–400.0)
RBC: 4.33 Mil/uL (ref 3.87–5.11)
RDW: 12.5 % (ref 11.5–15.5)
WBC: 4.7 10*3/uL (ref 4.0–10.5)

## 2018-08-27 LAB — POCT INFLUENZA A/B
INFLUENZA A, POC: NEGATIVE
INFLUENZA B, POC: NEGATIVE

## 2018-08-27 LAB — BASIC METABOLIC PANEL
BUN: 8 mg/dL (ref 6–23)
CO2: 29 meq/L (ref 19–32)
CREATININE: 0.7 mg/dL (ref 0.40–1.20)
Calcium: 9.5 mg/dL (ref 8.4–10.5)
Chloride: 99 mEq/L (ref 96–112)
GFR: 93.78 mL/min (ref >60.00)
GLUCOSE: 92 mg/dL (ref 70–99)
POTASSIUM: 4.5 meq/L (ref 3.5–5.1)
Sodium: 137 mEq/L (ref 135–145)

## 2018-08-27 LAB — POC URINALSYSI DIPSTICK (AUTOMATED)
Bilirubin, UA: NEGATIVE
GLUCOSE UA: NEGATIVE
Leukocytes, UA: NEGATIVE
Nitrite, UA: NEGATIVE
PH UA: 6 (ref 5.0–8.0)
Protein, UA: POSITIVE — AB
RBC UA: NEGATIVE
Spec Grav, UA: 1.025 (ref 1.010–1.025)
Urobilinogen, UA: 0.2 E.U./dL

## 2018-08-27 LAB — HEMOGLOBIN A1C: Hgb A1c MFr Bld: 5.2 % (ref 4.6–6.5)

## 2018-08-27 NOTE — Progress Notes (Signed)
Subjective:    Patient ID: Jennifer Shepard, female    DOB: [birth year], 52 y.o.   MRN: [id number]  HPI 52 year old female who  has no past medical history on file. She is a patient of Dr. Elease Hashimoto, who I am seeing today for an acute issue of body aches and chills. She denies nausea, vomiting, or diarrhea  Her symptoms started two days ago. Denies any UTI like symptoms.   Denies any sick contacts.   Review of Systems  Constitutional: Positive for activity change, chills, fatigue and fever.  Respiratory: Negative.   Cardiovascular: Negative.   Gastrointestinal: Positive for abdominal pain.  Genitourinary: Negative.   Musculoskeletal: Positive for arthralgias and myalgias.  Skin: Negative.   Hematological: Negative.   Psychiatric/Behavioral: Negative.   All other systems reviewed and are negative.  History reviewed. No pertinent past medical history.  Social History   Socioeconomic History  . Marital status: Divorced    Spouse name: Not on file  . Number of children: Not on file  . Years of education: Not on file  . Highest education level: Not on file  Occupational History  . Not on file  Social Needs  . Financial resource strain: Not on file  . Food insecurity:    Worry: Not on file    Inability: Not on file  . Transportation needs:    Medical: Not on file    Non-medical: Not on file  Tobacco Use  . Smoking status: Never Smoker  . Smokeless tobacco: Never Used  Substance and Sexual Activity  . Alcohol use: Yes    Comment: SOCIALLY ONLY  . Drug use: No  . Sexual activity: Never    Birth control/protection: IUD    Comment: Mirena IUD 07/23/2015  Lifestyle  . Physical activity:    Days per week: Not on file    Minutes per session: Not on file  . Stress: Not on file  Relationships  . Social connections:    Talks on phone: Not on file    Gets together: Not on file    Attends religious service: Not on file    Active member of club or organization: Not on file      Attends meetings of clubs or organizations: Not on file    Relationship status: Not on file  . Intimate partner violence:    Fear of current or ex partner: Not on file    Emotionally abused: Not on file    Physically abused: Not on file    Forced sexual activity: Not on file  Other Topics Concern  . Not on file  Social History Narrative  . Not on file    Past Surgical History:  Procedure Laterality Date  . APPENDECTOMY    . BREAST CYST ASPIRATION Right pt unsure   no visible scar  . CESAREAN SECTION  01/2004  . CESAREAN SECTION  10/2005  . INTRAUTERINE DEVICE INSERTION  06/26/2015   Mirena  . LASIK    . THYROIDECTOMY  07/2009   FOR THYROID CANCER; Dr Vicie Mutters    Family History  Problem Relation Age of Onset  . Hypertension Mother   . Hypertension Maternal Grandmother   . CAD Maternal Grandmother        CBAG in 68s; pacer  . Hyperlipidemia Father   . Cancer Neg Hx   . Diabetes Neg Hx   . Stroke Neg Hx     Allergies  Allergen Reactions  . Cefaclor Other (See Comments)  Angioedema of lips  . Morphine And Related Other (See Comments)    Severe headache  . Oxycodone-Acetaminophen Itching    itching    Current Outpatient Medications on File Prior to Visit  Medication Sig Dispense Refill  . ARMOUR THYROID 60 MG tablet Take 120 mg by mouth daily.   0  . cevimeline (EVOXAC) 30 MG capsule Take 30 mg by mouth 3 (three) times daily.    . Cholecalciferol (VITAMIN D PO) Take 5,000 Units by mouth daily.     Marland Kitchen lisdexamfetamine (VYVANSE) 40 MG capsule Take 1 capsule (40 mg total) by mouth every morning. 30 capsule 0  . lisdexamfetamine (VYVANSE) 40 MG capsule Take 1 capsule (40 mg total) by mouth daily. 30 capsule 0  . lisdexamfetamine (VYVANSE) 40 MG capsule Take 1 capsule (40 mg total) by mouth daily. 30 capsule 0  . naproxen sodium (ANAPROX) 220 MG tablet Take 220 mg by mouth as needed (headache/backache). Take every day    . Probiotic Product (PROBIOTIC DAILY PO) Take 1  tablet by mouth daily.    Marland Kitchen tetrahydrozoline 0.05 % ophthalmic solution Place 2 drops into both eyes as needed (Dry eyes).    . trimethoprim-polymyxin b (POLYTRIM) ophthalmic solution Place 2 drops into the left eye every 4 (four) hours. 10 mL 0  . valACYclovir (VALTREX) 500 MG tablet Take 1 tablet (500 mg total) by mouth 2 (two) times daily as needed (Take 3-5 days for fever blisters). 30 tablet 1  . VYVANSE 40 MG capsule Take 1 tablet by mouth daily.  0   Current Facility-Administered Medications on File Prior to Visit  Medication Dose Route Frequency Provider Last Rate Last Dose  . levonorgestrel (MIRENA) 20 MCG/24HR IUD   Intrauterine Once Fontaine, Timothy P, MD        BP 138/90   Temp 98.4 F (36.9 C) (Oral)   Wt 177 lb (80.3 kg)   BMI 25.95 kg/m       Objective:   Physical Exam  Constitutional: She is oriented to person, place, and time. She appears well-developed and well-nourished. She appears ill.  HENT:  Head: Normocephalic and atraumatic.  Right Ear: External ear normal.  Left Ear: External ear normal.  Nose: Nose normal.  Mouth/Throat: Oropharynx is clear and moist. No oropharyngeal exudate.  Eyes: Pupils are equal, round, and reactive to light. Conjunctivae and EOM are normal. Right eye exhibits no discharge. Left eye exhibits no discharge. No scleral icterus.  Cardiovascular: Normal rate, regular rhythm, normal heart sounds and intact distal pulses. Exam reveals no gallop and no friction rub.  No murmur heard. Pulmonary/Chest: Effort normal and breath sounds normal. No stridor. No respiratory distress. She has no wheezes. She has no rales. She exhibits no tenderness.  Abdominal: Normal appearance. There is no hepatosplenomegaly or splenomegaly. There is tenderness. There is CVA tenderness. There is no rigidity, no rebound, no guarding, no tenderness at McBurney's point and negative Murphy's sign. No hernia.  Neurological: She is alert and oriented to person, place,  and time.  Skin: Skin is warm and dry. She is not diaphoretic.  Psychiatric: She has a normal mood and affect. Her behavior is normal. Judgment and thought content normal.  Nursing note and vitals reviewed.     Assessment & Plan:   1. Generalized body aches - Will check POC flu due to symptoms but will get labs due to ketones in urine. No prior history of DM, she does drink alcohol but not routinely. She has not  had anything to eat today.  - POCT Urinalysis Dipstick (Automated)- + ketones and protein  - CBC with Differential/Platelet - Basic Metabolic Panel - Hemoglobin A1c - Hepatic function panel - POC Influenza A/B  3. Chills - POCT Urinalysis Dipstick (Automated) - CBC with Differential/Platelet - Basic Metabolic Panel - Hemoglobin A1c - Hepatic function panel - POC Influenza A/B  Dorothyann Peng, NP

## 2018-08-29 ENCOUNTER — Encounter: Payer: Self-pay | Admitting: Family Medicine

## 2018-08-29 DIAGNOSIS — Z1211 Encounter for screening for malignant neoplasm of colon: Secondary | ICD-10-CM

## 2018-09-03 DIAGNOSIS — F4321 Adjustment disorder with depressed mood: Secondary | ICD-10-CM | POA: Diagnosis not present

## 2018-09-05 ENCOUNTER — Encounter: Payer: Self-pay | Admitting: Family Medicine

## 2018-09-19 DIAGNOSIS — F4321 Adjustment disorder with depressed mood: Secondary | ICD-10-CM | POA: Diagnosis not present

## 2018-09-26 DIAGNOSIS — F4321 Adjustment disorder with depressed mood: Secondary | ICD-10-CM | POA: Diagnosis not present

## 2018-10-02 DIAGNOSIS — F4321 Adjustment disorder with depressed mood: Secondary | ICD-10-CM | POA: Diagnosis not present

## 2018-10-04 ENCOUNTER — Ambulatory Visit: Payer: BLUE CROSS/BLUE SHIELD | Admitting: Obstetrics & Gynecology

## 2018-10-04 ENCOUNTER — Encounter: Payer: Self-pay | Admitting: Obstetrics & Gynecology

## 2018-10-04 VITALS — BP 146/88

## 2018-10-04 DIAGNOSIS — Z113 Encounter for screening for infections with a predominantly sexual mode of transmission: Secondary | ICD-10-CM

## 2018-10-04 DIAGNOSIS — Z30431 Encounter for routine checking of intrauterine contraceptive device: Secondary | ICD-10-CM | POA: Diagnosis not present

## 2018-10-04 DIAGNOSIS — A6004 Herpesviral vulvovaginitis: Secondary | ICD-10-CM | POA: Diagnosis not present

## 2018-10-04 NOTE — Addendum Note (Signed)
Addended by: Thurnell Garbe A on: 10/04/2018 03:27 PM   Modules accepted: Orders

## 2018-10-04 NOTE — Patient Instructions (Signed)
1. Screen for STD (sexually transmitted disease) Unfaithful partner.  No evidence of PID.  Full STD work-up done.  Condom use recommended - Gono-Chlam - HIV antibody (with reflex) - RPR - Hepatitis C Antibody - Hepatitis B Surface AntiGEN  2. Encounter for routine checking of intrauterine contraceptive device (IUD) Mirena IUD x 07/2015.  Well-tolerated and in good position.  3. Herpes simplex vulvovaginitis Possible recurrence of genital herpes on the left posterior vulva.  Patient already has a prescription of valacyclovir.  Will start today.  Grayce, it was a pleasure meeting you today!  I will inform you of your results as soon as they are available.

## 2018-10-04 NOTE — Progress Notes (Signed)
    Jennifer Shepard Jul 02, 1966 [id number]        52 y.o.  [id number]   RP: Full STD work-up  HPI: Unfaithful partner.  Well on Mirena IUD since August 2016.  No abnormal bleeding.  No pelvic pain.  No vaginal discharge.  No fever.  Urine and bowel movements normal.   OB History  Gravida Para Term Preterm AB Living  4 2     2 2   SAB TAB Ectopic Multiple Live Births               # Outcome Date GA Lbr Len/2nd Weight Sex Delivery Anes PTL Lv  4 AB           3 AB           2 Para           1 Para             Past medical history,surgical history, problem list, medications, allergies, family history and social history were all reviewed and documented in the EPIC chart.   Directed ROS with pertinent positives and negatives documented in the history of present illness/assessment and plan.  Exam:  Vitals:   10/04/18 1431  BP: (!) 146/88   General appearance:  Normal  Abdomen: Normal  Gynecologic exam: Vulva:  Small ulcer Left posterior vulva, c/w HSV recurrence.  Speculum: Cervix normal, no erythema.  IUD strings visible.  Normal vagina.  Normal vaginal secretions.  Gonorrhea and Chlamydia done on the cervix.  Bimanual exam: Uterus anteverted, normal volume, nontender and mobile.  No adnexal mass, nontender bilaterally.   Assessment/Plan:  52 y.o. [id number]   1. Screen for STD (sexually transmitted disease) Unfaithful partner.  No evidence of PID.  Full STD work-up done.  Condom use recommended - Gono-Chlam - HIV antibody (with reflex) - RPR - Hepatitis C Antibody - Hepatitis B Surface AntiGEN  2. Encounter for routine checking of intrauterine contraceptive device (IUD) Mirena IUD x 07/2015.  Well-tolerated and in good position.  3. Herpes simplex vulvovaginitis Possible recurrence of genital herpes on the left posterior vulva.  Patient already has a prescription of valacyclovir.  Will start today.  Counseling on above issues and coordination of care more than 50% for 15  minutes.  Princess Bruins MD, 2:34 PM 10/04/2018

## 2018-10-05 LAB — C. TRACHOMATIS/N. GONORRHOEAE RNA
C. trachomatis RNA, TMA: NOT DETECTED
N. gonorrhoeae RNA, TMA: NOT DETECTED

## 2018-10-07 ENCOUNTER — Encounter: Payer: Self-pay | Admitting: *Deleted

## 2018-10-07 LAB — RPR: RPR Ser Ql: NONREACTIVE

## 2018-10-07 LAB — HIV ANTIBODY (ROUTINE TESTING W REFLEX): HIV: NONREACTIVE

## 2018-10-07 LAB — HEPATITIS C ANTIBODY
HEP C AB: NONREACTIVE
SIGNAL TO CUT-OFF: 0.03 (ref <1.00)

## 2018-10-07 LAB — HEPATITIS B SURFACE ANTIGEN: Hepatitis B Surface Ag: NONREACTIVE

## 2018-10-08 ENCOUNTER — Other Ambulatory Visit: Payer: Self-pay | Admitting: Women's Health

## 2018-10-08 DIAGNOSIS — Z1231 Encounter for screening mammogram for malignant neoplasm of breast: Secondary | ICD-10-CM

## 2018-10-10 DIAGNOSIS — F4321 Adjustment disorder with depressed mood: Secondary | ICD-10-CM | POA: Diagnosis not present

## 2018-10-11 NOTE — Telephone Encounter (Signed)
Copied from [place] [id number]. Topic: Quick Communication - See Telephone Encounter >> Oct 11, 2018  9:18 AM Hewitt Shorts wrote: Pt is needing a refill on vyvanse   Best number [phone] when ready to pick up -[facility] usually gives her 3 months rx at a time on one sheet -good on meds til  Monday

## 2018-10-15 ENCOUNTER — Telehealth: Payer: Self-pay | Admitting: Family Medicine

## 2018-10-15 MED ORDER — LISDEXAMFETAMINE DIMESYLATE 40 MG PO CAPS: ORAL_CAPSULE | ORAL | 0 refills | Status: DC

## 2018-10-15 MED ORDER — LISDEXAMFETAMINE DIMESYLATE 40 MG PO CAPS: 40.0000 mg | ORAL_CAPSULE | ORAL | 0 refills | Status: DC

## 2018-10-15 NOTE — Telephone Encounter (Signed)
Last OV 08/27/18 with Dorothyann Peng, No future OV  Last filled 07/16/18 # 30 with 0 refills

## 2018-10-15 NOTE — Telephone Encounter (Signed)
Patient calling and states that she is completely out of the medication for today. States that she would like to pick up the prescription today.

## 2018-10-15 NOTE — Telephone Encounter (Signed)
Copied from [place] [id number]. Topic: Quick Communication - See Telephone Encounter >> Oct 11, 2018  9:18 AM Hewitt Shorts wrote: Pt is needing a refill on vyvanse   Best number [phone] when ready to pick up -[facility] usually gives her 3 months rx at a time on one sheet -good on meds til  Monday

## 2018-10-15 NOTE — Telephone Encounter (Signed)
Let her know we sent in three months of refills electronically.

## 2018-10-16 ENCOUNTER — Encounter: Payer: BLUE CROSS/BLUE SHIELD | Admitting: Women's Health

## 2018-10-16 NOTE — Telephone Encounter (Signed)
Pt is aware rxs sent eletronically

## 2018-10-17 DIAGNOSIS — F4321 Adjustment disorder with depressed mood: Secondary | ICD-10-CM | POA: Diagnosis not present

## 2018-11-01 ENCOUNTER — Encounter: Payer: Self-pay | Admitting: Family Medicine

## 2018-11-12 ENCOUNTER — Ambulatory Visit: Payer: BLUE CROSS/BLUE SHIELD

## 2018-11-18 DIAGNOSIS — F4321 Adjustment disorder with depressed mood: Secondary | ICD-10-CM | POA: Diagnosis not present

## 2018-11-25 ENCOUNTER — Ambulatory Visit (INDEPENDENT_AMBULATORY_CARE_PROVIDER_SITE_OTHER): Payer: BLUE CROSS/BLUE SHIELD | Admitting: Women's Health

## 2018-11-25 ENCOUNTER — Encounter: Payer: Self-pay | Admitting: Women's Health

## 2018-11-25 VITALS — BP 122/78 | Ht 70.0 in | Wt 178.0 lb

## 2018-11-25 DIAGNOSIS — Z113 Encounter for screening for infections with a predominantly sexual mode of transmission: Secondary | ICD-10-CM

## 2018-11-25 DIAGNOSIS — Z13220 Encounter for screening for lipoid disorders: Secondary | ICD-10-CM | POA: Diagnosis not present

## 2018-11-25 DIAGNOSIS — Z01419 Encounter for gynecological examination (general) (routine) without abnormal findings: Secondary | ICD-10-CM

## 2018-11-25 DIAGNOSIS — Z1151 Encounter for screening for human papillomavirus (HPV): Secondary | ICD-10-CM | POA: Diagnosis not present

## 2018-11-25 DIAGNOSIS — B009 Herpesviral infection, unspecified: Secondary | ICD-10-CM | POA: Diagnosis not present

## 2018-11-25 MED ORDER — VALACYCLOVIR HCL 500 MG PO TABS: 500.0000 mg | ORAL_TABLET | Freq: Two times a day (BID) | ORAL | 1 refills | Status: DC | PRN

## 2018-11-25 NOTE — Addendum Note (Signed)
Addended by: Nelva Nay on: 11/25/2018 12:28 PM   Modules accepted: Orders

## 2018-11-25 NOTE — Progress Notes (Signed)
Jennifer Shepard 1966-05-23 [id number]    History:    Presents for annual exam.  07/2015 Mirena IUD rare spotting.  07/2018 thyroid cancer with thyroidectomy on Synthroid per endocrinologist.  Normal Pap and mammogram history.  Has not had a screening colonoscopy.  HSV no outbreaks.   Past medical history, past surgical history, family history and social history were all reviewed and documented in the EPIC chart.  Works in Insurance underwriter.  Past unfaithful partner homosexual relation.  Negative STD screen after break-up.  2 children ages 70 and 28 both doing well and have had Gardasil.  ROS:  A ROS was performed and pertinent positives and negatives are included.  Exam:  Vitals:   11/25/18 1131  BP: 122/78  Weight: 178 lb (80.7 kg)  Height: 5\' 10"  (1.778 m)   Body mass index is 25.54 kg/m.   General appearance:  Normal Thyroid:  Symmetrical, normal in size, without palpable masses or nodularity. Respiratory  Auscultation:  Clear without wheezing or rhonchi Cardiovascular  Auscultation:  Regular rate, without rubs, murmurs or gallops  Edema/varicosities:  Not grossly evident Abdominal  Soft,nontender, without masses, guarding or rebound.  Liver/spleen:  No organomegaly noted  Hernia:  None appreciated  Skin  Inspection:  Grossly normal   Breasts: Examined lying and sitting.     Right: Without masses, retractions, discharge or axillary adenopathy.     Left: Without masses, retractions, discharge or axillary adenopathy. Gentitourinary   Inguinal/mons:  Normal without inguinal adenopathy  External genitalia:  Normal  BUS/Urethra/Skene's glands:  Normal  Vagina:  Normal  Cervix:  Normal IUD strings visible  Uterus:normal in size, shape and contour.  Midline and mobile  Adnexa/parametria:     Rt: Without masses or tenderness.   Lt: Without masses or tenderness.  Anus and perineum: Normal  Digital rectal exam: Normal sphincter tone without palpated masses or  tenderness  Assessment/Plan:  52 y.o. D WF G4, P2 for annual exam.    07/2015 Mirena IUD rare spotting Hypothyroid- endocrinologist manages HSV no outbreaks  Plan: Screening colonoscopy reviewed, states plans to schedule in January.  SBE's, continue annual screening mammogram, calcium rich foods, vitamin D 2000 daily encouraged.  Regular weightbearing and balance type exercise reviewed.  Counseling encouraged, recent bad break-up.  CBC, CMP, lipid panel, Pap with HR HPV typing, HIV.   Huel Cote [facility], 12:05 PM 11/25/2018

## 2018-11-25 NOTE — Patient Instructions (Signed)

## 2018-11-26 LAB — LIPID PANEL
CHOL/HDL RATIO: 2.2 (calc) (ref <5.0)
Cholesterol: 214 mg/dL — ABNORMAL HIGH (ref <200)
HDL: 98 mg/dL (ref >50)
LDL Cholesterol (Calc): 102 mg/dL (calc) — ABNORMAL HIGH
NON-HDL CHOLESTEROL (CALC): 116 mg/dL (ref <130)
Triglycerides: 49 mg/dL (ref <150)

## 2018-11-26 LAB — CBC WITH DIFFERENTIAL/PLATELET
BASOS ABS: 30 {cells}/uL (ref 0–200)
Basophils Relative: 0.6 %
EOS PCT: 0.4 %
Eosinophils Absolute: 20 cells/uL (ref 15–500)
HCT: 38.6 % (ref 35.0–45.0)
Hemoglobin: 13.3 g/dL (ref 11.7–15.5)
Lymphs Abs: 1880 cells/uL (ref 850–3900)
MCH: 31.4 pg (ref 27.0–33.0)
MCHC: 34.5 g/dL (ref 32.0–36.0)
MCV: 91 fL (ref 80.0–100.0)
MPV: 10.4 fL (ref 7.5–12.5)
Monocytes Relative: 8.7 %
NEUTROS PCT: 52.7 %
Neutro Abs: 2635 cells/uL (ref 1500–7800)
PLATELETS: 310 10*3/uL (ref 140–400)
RBC: 4.24 10*6/uL (ref 3.80–5.10)
RDW: 12.8 % (ref 11.0–15.0)
TOTAL LYMPHOCYTE: 37.6 %
WBC mixed population: 435 cells/uL (ref 200–950)
WBC: 5 10*3/uL (ref 3.8–10.8)

## 2018-11-26 LAB — HIV ANTIBODY (ROUTINE TESTING W REFLEX): HIV: NONREACTIVE

## 2018-11-26 LAB — COMPREHENSIVE METABOLIC PANEL
AG Ratio: 2 (calc) (ref 1.0–2.5)
ALT: 21 U/L (ref 6–29)
AST: 20 U/L (ref 10–35)
Albumin: 4.8 g/dL (ref 3.6–5.1)
Alkaline phosphatase (APISO): 54 U/L (ref 33–130)
BUN: 10 mg/dL (ref 7–25)
CO2: 28 mmol/L (ref 20–32)
CREATININE: 0.64 mg/dL (ref 0.50–1.05)
Calcium: 9.5 mg/dL (ref 8.6–10.4)
Chloride: 100 mmol/L (ref 98–110)
GLUCOSE: 98 mg/dL (ref 65–99)
Globulin: 2.4 g/dL (calc) (ref 1.9–3.7)
Potassium: 4.2 mmol/L (ref 3.5–5.3)
Sodium: 136 mmol/L (ref 135–146)
Total Bilirubin: 0.8 mg/dL (ref 0.2–1.2)
Total Protein: 7.2 g/dL (ref 6.1–8.1)

## 2018-11-26 LAB — PAP, TP IMAGING W/ HPV RNA, RFLX HPV TYPE 16,18/45: HPV DNA High Risk: NOT DETECTED

## 2018-11-27 DIAGNOSIS — F4321 Adjustment disorder with depressed mood: Secondary | ICD-10-CM | POA: Diagnosis not present

## 2018-12-30 ENCOUNTER — Ambulatory Visit
Admission: RE | Admit: 2018-12-30 | Discharge: 2018-12-30 | Disposition: A | Discharge status: Home / Self Care | Payer: BLUE CROSS/BLUE SHIELD | Source: Ambulatory Visit | Attending: Women's Health | Admitting: Women's Health

## 2018-12-30 DIAGNOSIS — Z1231 Encounter for screening mammogram for malignant neoplasm of breast: Secondary | ICD-10-CM | POA: Diagnosis not present

## 2019-01-03 DIAGNOSIS — H04121 Dry eye syndrome of right lacrimal gland: Secondary | ICD-10-CM | POA: Diagnosis not present

## 2019-01-03 DIAGNOSIS — H04122 Dry eye syndrome of left lacrimal gland: Secondary | ICD-10-CM | POA: Diagnosis not present

## 2019-01-07 ENCOUNTER — Other Ambulatory Visit: Payer: Self-pay | Admitting: Family Medicine

## 2019-01-07 NOTE — Telephone Encounter (Signed)
Called patient left VM to call and schedule appointment.

## 2019-01-07 NOTE — Telephone Encounter (Signed)
Requested medication (s) are due for refill today:yes  Requested medication (s) are on the active medication list: yes  Last refill:  10/15/18  Future visit scheduled: no  Notes to clinic:  Call to patient left VM to call for appointment.  Medication not delegated.    Requested Prescriptions  Pending Prescriptions Disp Refills   lisdexamfetamine (VYVANSE) 40 MG capsule 30 capsule 0    Sig: Take 1 capsule (40 mg total) by mouth every morning.     Not Delegated - Psychiatry:  Stimulants/ADHD Failed - 01/07/2019  5:02 PM      Failed - This refill cannot be delegated      Failed - Urine Drug Screen completed in last 360 days.      Failed - Valid encounter within last 3 months    Recent Outpatient Visits          4 months ago Generalized body aches   Therapist, music at [employer], [place], NP   10 months ago Eardrum trauma, right, subsequent Education administrator at [employer], Alinda Sierras, MD   11 months ago Eardrum trauma, right, initial Education administrator at [employer], Alinda Sierras, MD   2 years ago Elevated blood pressure reading without diagnosis of hypertension   Therapist, music at [employer], [place], [place]   3 years ago Occipital neuralgia of right side   [facility], Darrick Penna, MD

## 2019-01-07 NOTE — Telephone Encounter (Signed)
Copied from [place] [id number]. Topic: Quick Communication - Rx Refill/Question >> Jan 07, 2019  4:46 PM Sheran Luz wrote: Medication: lisdexamfetamine (VYVANSE) 40 MG capsule   Patient is requesting a refill of this medication w 90 day supply, if possible.   Preferred Pharmacy (with phone number or street name): [facility] [phone] - [place], [place] - [address] [phone] (Phone) [phone] (Fax)

## 2019-01-08 NOTE — Telephone Encounter (Signed)
Pt has made an appt for 01-13-19. Pt unable to come in today. Pt would like a callback

## 2019-01-10 MED ORDER — LISDEXAMFETAMINE DIMESYLATE 40 MG PO CAPS: 40.0000 mg | ORAL_CAPSULE | ORAL | 0 refills | Status: DC

## 2019-01-10 NOTE — Telephone Encounter (Signed)
Please see messages. Please advise. °

## 2019-01-10 NOTE — Telephone Encounter (Signed)
One refill sent 

## 2019-01-13 ENCOUNTER — Other Ambulatory Visit: Payer: Self-pay

## 2019-01-13 ENCOUNTER — Encounter: Payer: Self-pay | Admitting: Family Medicine

## 2019-01-13 ENCOUNTER — Ambulatory Visit: Payer: BLUE CROSS/BLUE SHIELD | Admitting: Family Medicine

## 2019-01-13 VITALS — BP 158/90 | HR 104 | Temp 97.7°F | Ht 70.0 in | Wt 181.4 lb

## 2019-01-13 DIAGNOSIS — F900 Attention-deficit hyperactivity disorder, predominantly inattentive type: Secondary | ICD-10-CM

## 2019-01-13 DIAGNOSIS — R030 Elevated blood-pressure reading, without diagnosis of hypertension: Secondary | ICD-10-CM | POA: Diagnosis not present

## 2019-01-13 MED ORDER — LISDEXAMFETAMINE DIMESYLATE 40 MG PO CAPS: ORAL_CAPSULE | ORAL | 0 refills | Status: DC

## 2019-01-13 NOTE — Progress Notes (Signed)
  Subjective:     Patient ID: Jennifer Shepard, female   DOB: 25-Dec-1966, 53 y.o.   MRN: [id number]  HPI Patient seen for follow-up regarding attention of disorder.  She also has hypothyroidism and is followed by endocrinologist over in [place].  She sees gynecologist regularly.  She is on Vyvanse 40 mg daily.  No side effects from medication.  She does have elevated blood pressure today but no history of hypertension.  She has not had any recent headaches, chest pain, dizziness, flushing.  No peripheral edema.  Does drink about 2 beers per day most days.  She has a home blood pressure monitor but has not been monitoring her blood pressure recently.  She was just at gynecologist recently and had blood pressure reading 122/78.  No decongestant use  Past Medical History:  Diagnosis Date  . Cancer [facility])    Thyroid   Past Surgical History:  Procedure Laterality Date  . APPENDECTOMY    . BREAST CYST ASPIRATION Right pt unsure   no visible scar  . CESAREAN SECTION  01/2004  . CESAREAN SECTION  10/2005  . INTRAUTERINE DEVICE INSERTION  06/26/2015   Mirena  . LASIK    . THYROIDECTOMY  07/2009   FOR THYROID CANCER; Dr Vicie Mutters    reports that she has never smoked. She has never used smokeless tobacco. She reports current alcohol use. She reports that she does not use drugs. family history includes CAD in her maternal grandmother; Hyperlipidemia in her father; Hypertension in her maternal grandmother and mother. Allergies  Allergen Reactions  . Cefaclor Other (See Comments)    Angioedema of lips  . Morphine And Related Other (See Comments)    Severe headache  . Tyloxapol   . Oxycodone-Acetaminophen Itching    itching     Review of Systems  Constitutional: Negative for fatigue.  Eyes: Negative for visual disturbance.  Respiratory: Negative for cough, chest tightness, shortness of breath and wheezing.   Cardiovascular: Negative for chest pain, palpitations and leg swelling.   Neurological: Negative for dizziness, seizures, syncope, weakness, light-headedness and headaches.       Objective:   Physical Exam Constitutional:      Appearance: She is well-developed.  Eyes:     Pupils: Pupils are equal, round, and reactive to light.  Neck:     Musculoskeletal: Neck supple.     Thyroid: No thyromegaly.     Vascular: No JVD.  Cardiovascular:     Rate and Rhythm: Normal rate and regular rhythm.     Heart sounds: No gallop.   Pulmonary:     Effort: Pulmonary effort is normal. No respiratory distress.     Breath sounds: Normal breath sounds. No wheezing or rales.  Neurological:     Mental Status: She is alert.        Assessment:     #1 attention deficit disorder-stable on Vyvanse  #2 elevated blood pressure with no prior diagnosis of hypertension    Plan:     -Record several home blood pressure readings and bring back for nurse to recheck with her machine with ours in the next couple weeks -Limit alcohol to no more than 12 ounces beer per day -Refilled Vyvanse but will need to reconsider if blood pressure remains poorly controlled  Eulas Post MD [facility]

## 2019-01-13 NOTE — Patient Instructions (Signed)
Set up nursing follow up to reassess BP and bring in your cuff for comparison.  Gradually reduce caffeine  Keep beer down to < 24 ounces per day.

## 2019-01-23 IMAGING — MG DIGITAL SCREENING BILATERAL MAMMOGRAM WITH TOMO AND CAD
8 series · 8 of 24 positions shown · non-contrast
Comparison: Previous exam(s).

CLINICAL DATA: Screening.

EXAM:
DIGITAL SCREENING BILATERAL MAMMOGRAM WITH TOMO AND CAD

[R MLO synth-2D]
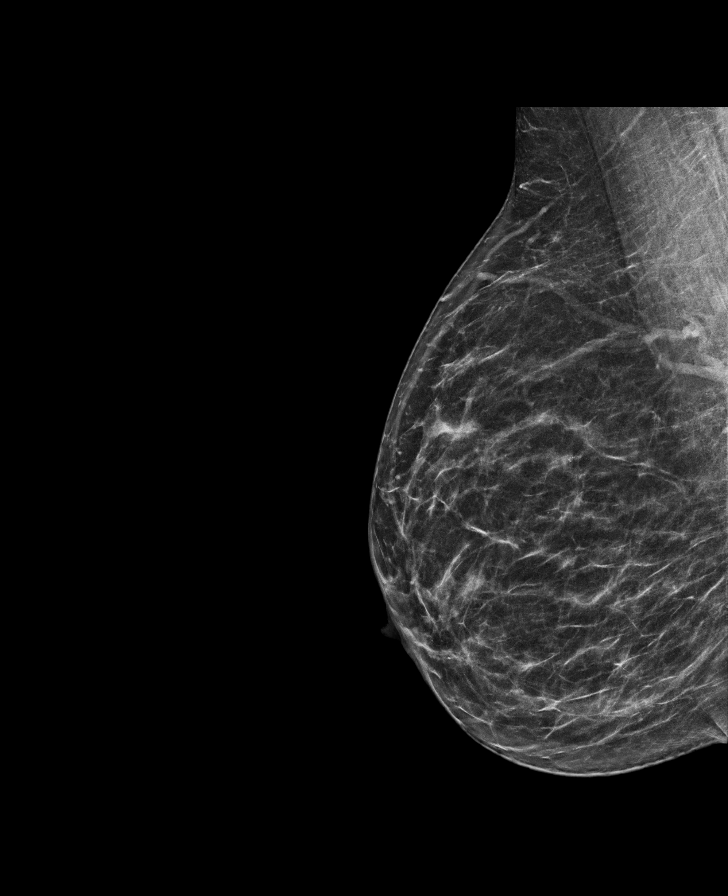

[L MLO synth-2D]
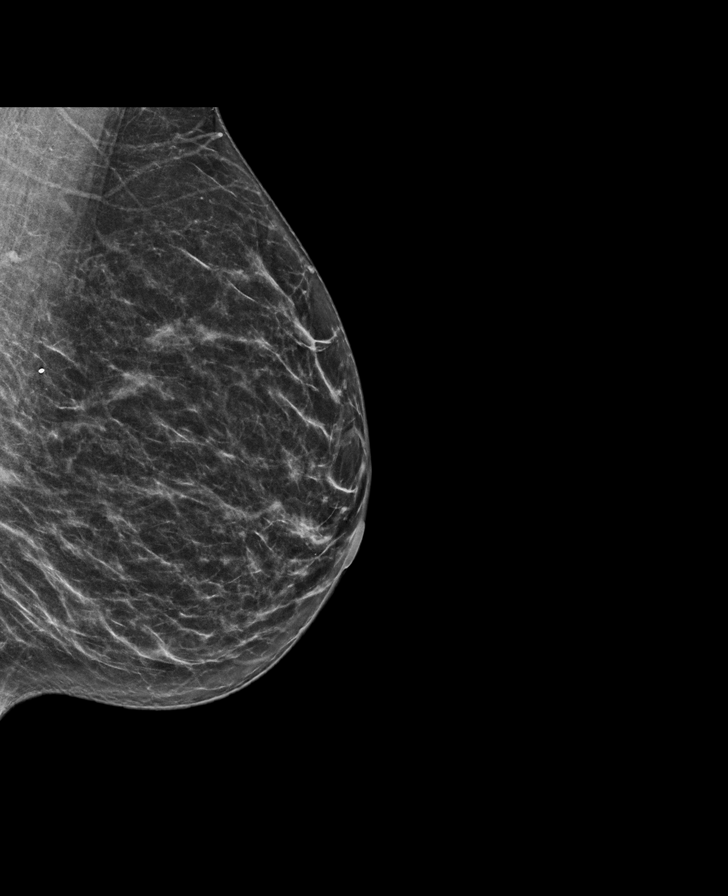

[R CC synth-2D]
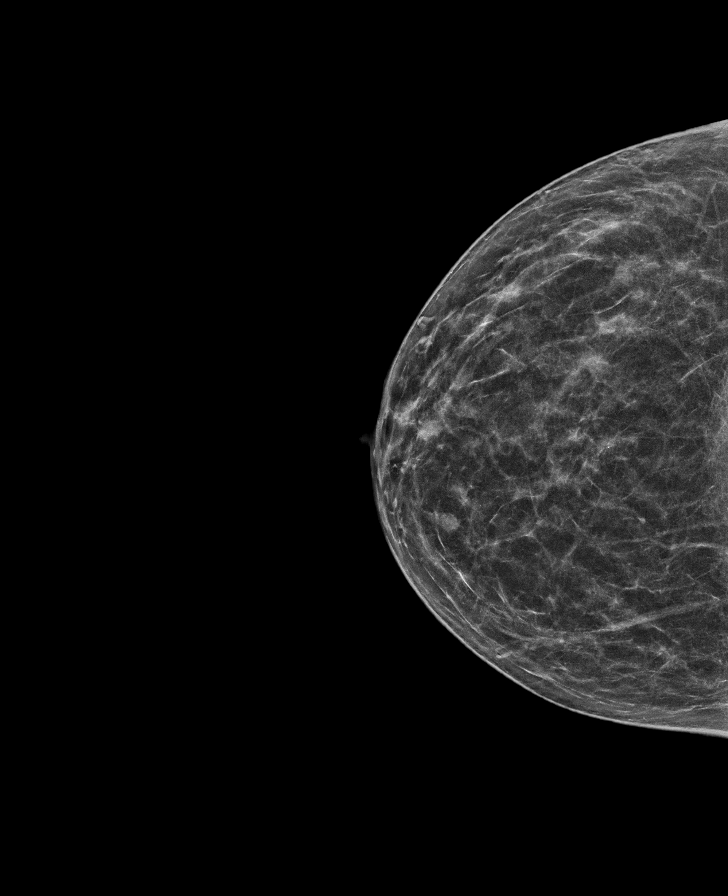

[L CC synth-2D]
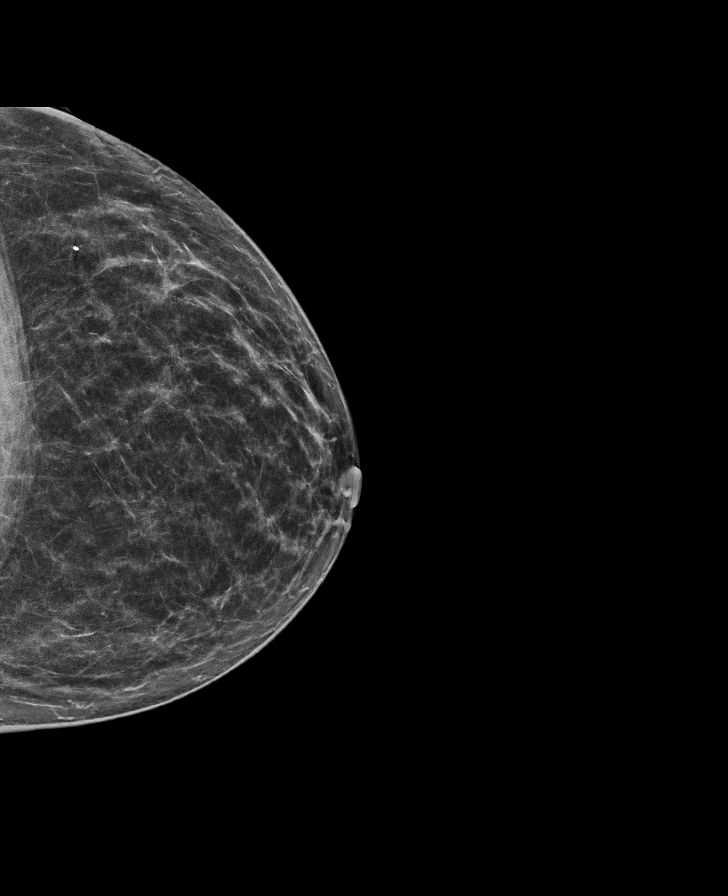

[R MLO tomo · tomo slice 32/63.0]
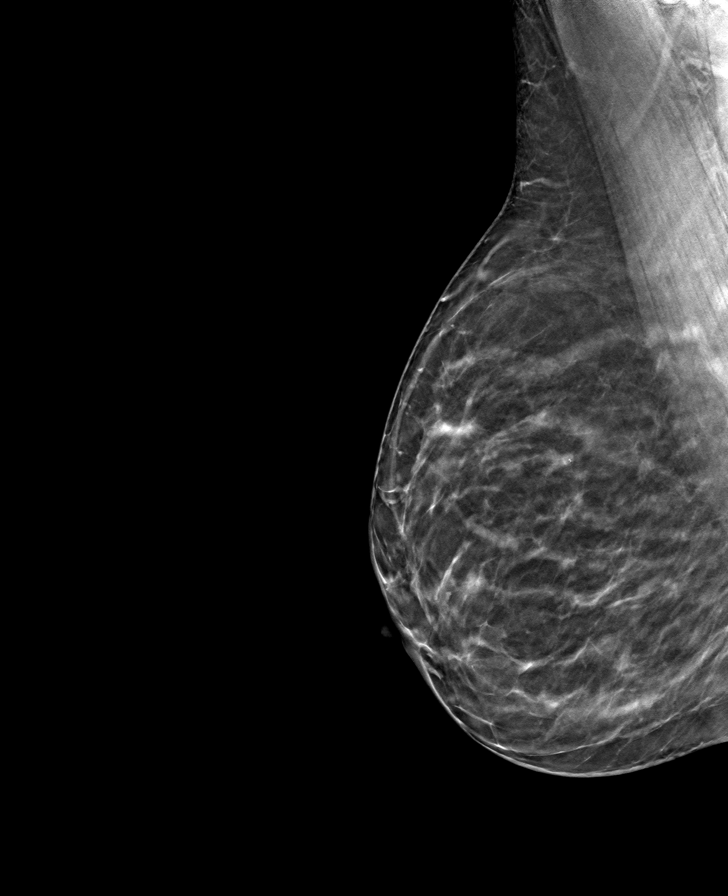

[L MLO tomo · tomo slice 31/62.0]
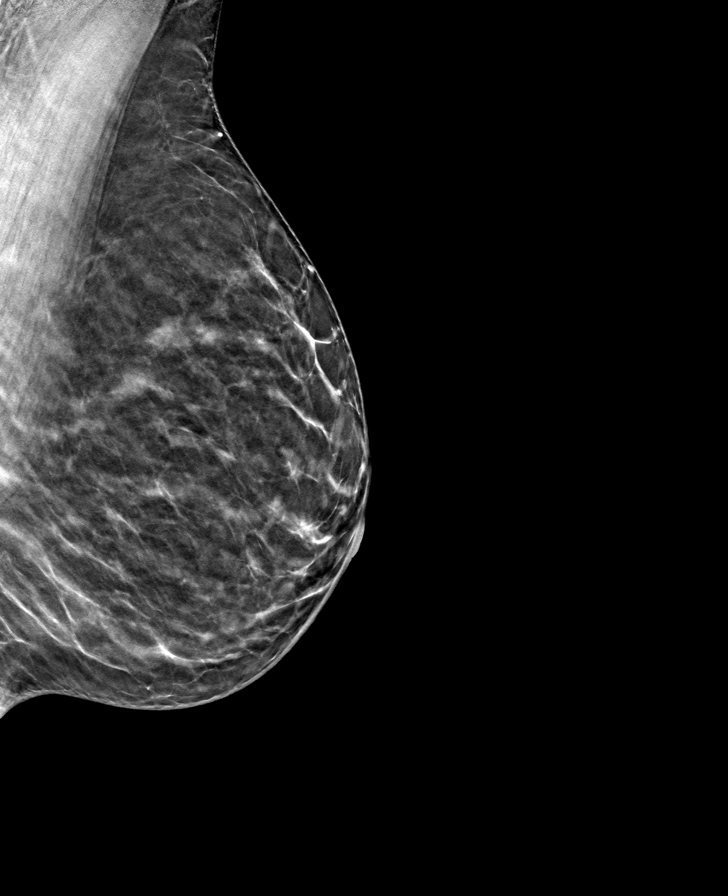

[L CC tomo · tomo slice 34/67.0]
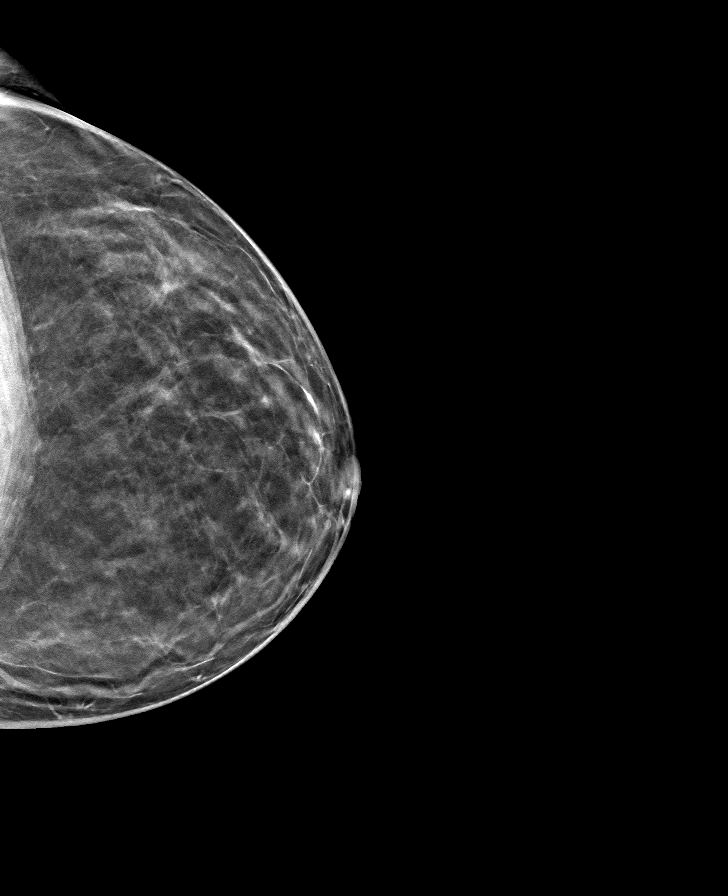

[R CC tomo · tomo slice 31/61.0]
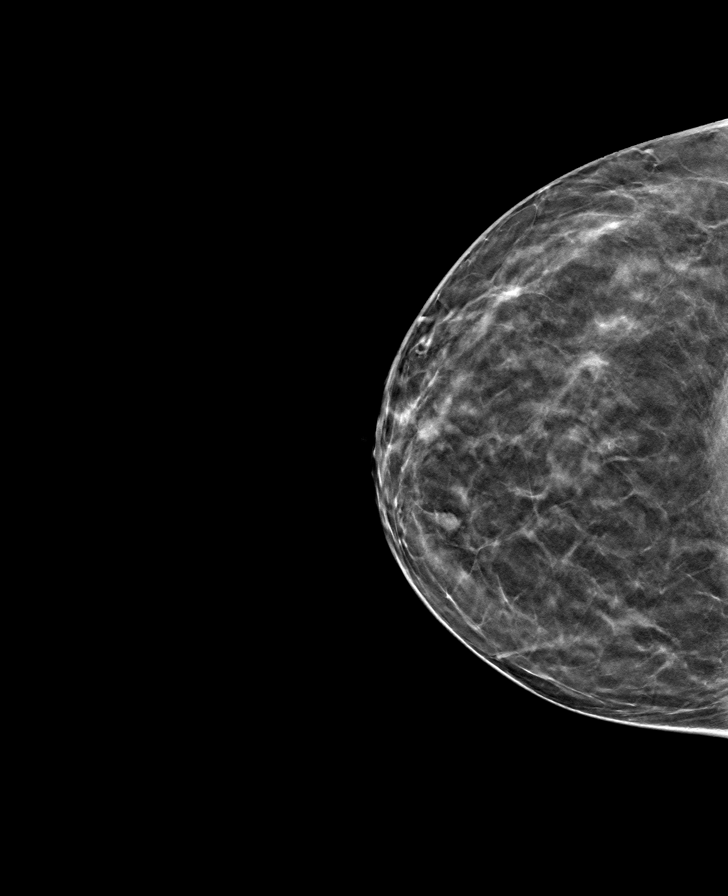

[8 of 24 positions shown; findings below may reference images not displayed]

ACR Breast Density Category c: The breast tissue is heterogeneously
dense, which may obscure small masses.
FINDINGS: There are no findings suspicious for malignancy. Images were
processed with CAD.
IMPRESSION: No mammographic evidence of malignancy. A result letter of this
screening mammogram will be mailed directly to the patient.

RECOMMENDATION:
Screening mammogram in one year. (Code:FT-U-LHB)

BI-RADS CATEGORY  1: Negative.

## 2019-02-04 DIAGNOSIS — H04123 Dry eye syndrome of bilateral lacrimal glands: Secondary | ICD-10-CM | POA: Diagnosis not present

## 2019-02-25 DIAGNOSIS — D1801 Hemangioma of skin and subcutaneous tissue: Secondary | ICD-10-CM | POA: Diagnosis not present

## 2019-02-25 DIAGNOSIS — D225 Melanocytic nevi of trunk: Secondary | ICD-10-CM | POA: Diagnosis not present

## 2019-02-25 DIAGNOSIS — L570 Actinic keratosis: Secondary | ICD-10-CM | POA: Diagnosis not present

## 2019-02-25 DIAGNOSIS — L700 Acne vulgaris: Secondary | ICD-10-CM | POA: Diagnosis not present

## 2019-02-25 DIAGNOSIS — L309 Dermatitis, unspecified: Secondary | ICD-10-CM | POA: Diagnosis not present

## 2019-03-27 ENCOUNTER — Telehealth: Payer: Self-pay | Admitting: *Deleted

## 2019-03-27 NOTE — Telephone Encounter (Signed)
Patient called after hours line on 03/26/2019 at  8:26 PM. Patient reported she was exposed to someone who is now self-quarantined. States she is having cold symptoms. Wants to know what they should be doing. Patient was advised by the on call RN.    Clinic RN reached out to patient. Patient reports her co worker is on self quarantine for 14 days. Patient reports she is okay right now. Informed patient if she begins to get symptomatic to please call office to set up Web ex visit with PCP. Patient verbalized understanding.

## 2019-03-27 NOTE — Telephone Encounter (Signed)
Agree with advice.  As long as no dyspnea or trouble breathing would observe for now.   Definitely offer Webex- if she is interested.

## 2019-03-31 ENCOUNTER — Ambulatory Visit: Payer: Self-pay | Admitting: *Deleted

## 2019-03-31 NOTE — Telephone Encounter (Signed)
Let's offer Doxy-me follow up tomorrow- if she will (as long as no chest pain or other more acute symptoms).

## 2019-03-31 NOTE — Telephone Encounter (Signed)
Appointment scheduled.

## 2019-03-31 NOTE — Telephone Encounter (Signed)
Patient calls with heartbeat irregularities. Stated she noticed yesterday the heart would speed up then skip a beat. Today, heart feels like it will skip a beat sometimes several in one minute then hours before it occurs again. No sweating/CP/difficulty breathing/SOB/edema/weakness. Stated they have been monitoring her b/p, not on any medications for it at this time. Yesterdays b/p was 159/96 hr 106, today's 156/104, p87. Stated this has occurred on/off over the years and was seen by cardiology years ago with no follow up. Denies dizziness/vision changes/headaches.advised an appointment may be needed. Routing to the PCP for further contact.  Reason for Disposition . [1] Skipped or extra beat(s) AND [2] occurs 4 or more times per minute  Answer Assessment - Initial Assessment Questions 1. DESCRIPTION: "Please describe your heart rate or heart beat that you are having" (e.g., fast/slow, regular/irregular, skipped or extra beats, "palpitations")     156/104, pulse 99 bpm now    2. ONSET: "When did it start?" (Minutes, hours or days)      Has happened in the past. Yesterday noticed. 3. DURATION: "How long does it last" (e.g., seconds, minutes, hours)     Varies from several times/minute to once every few hours. 4. PATTERN "Does it come and go, or has it been constant since it started?"  "Does it get worse with exertion?"   "Are you feeling it now?"     Doesn't change with exertion. Happens at all times.  5. TAP: "Using your hand, can you tap out what you are feeling on a chair or table in front of you, so that I can hear?" (Note: not all patients can do this)       6. HEART RATE: "Can you tell me your heart rate?" "How many beats in 15 seconds?"  (Note: not all patients can do this)      See above 7. RECURRENT SYMPTOM: "Have you ever had this before?" If so, ask: "When was the last time?" and "What happened that time?"      Yes 8. CAUSE: "What do you think is causing the palpitations?"      unknown 9. CARDIAC HISTORY: "Do you have any history of heart disease?" (e.g., heart attack, angina, bypass surgery, angioplasty, arrhythmia)      Monitoring high blood pressure 10. OTHER SYMPTOMS: "Do you have any other symptoms?" (e.g., dizziness, chest pain, sweating, difficulty breathing)       none 11. PREGNANCY: "Is there any chance you are pregnant?" "When was your last menstrual period?"      no  Protocols used: HEART RATE AND HEARTBEAT QUESTIONS-A-AH

## 2019-04-01 ENCOUNTER — Other Ambulatory Visit: Payer: Self-pay

## 2019-04-01 ENCOUNTER — Ambulatory Visit (INDEPENDENT_AMBULATORY_CARE_PROVIDER_SITE_OTHER): Payer: BLUE CROSS/BLUE SHIELD | Admitting: Family Medicine

## 2019-04-01 DIAGNOSIS — I10 Essential (primary) hypertension: Secondary | ICD-10-CM

## 2019-04-01 DIAGNOSIS — R002 Palpitations: Secondary | ICD-10-CM | POA: Diagnosis not present

## 2019-04-01 MED ORDER — METOPROLOL SUCCINATE ER 50 MG PO TB24: 50.0000 mg | ORAL_TABLET | Freq: Every day | ORAL | 5 refills | Status: DC

## 2019-04-01 NOTE — Progress Notes (Signed)
Patient ID: Jennifer Shepard, female   DOB: 1966-10-03, 53 y.o.   MRN: [id number]  Virtual Visit via Video Note  I connected with Jennifer Shepard on 04/01/19 at 10:30 AM EDT by a video enabled telemedicine application and verified that I am speaking with the correct person using two identifiers.  Location patient: home Location provider:work or home office Persons participating in the virtual visit: patient, provider  I discussed the limitations of evaluation and management by telemedicine and the availability of in person appointments. The patient expressed understanding and agreed to proceed.   HPI:  Patient had called yesterday with some recent increased palpitations.  She was exposed to someone at work who had recent fever but no known COVID-19 exposures.  Patient denies any fever or cough.  She has had some intermittent palpitations and occasional sensation of irregular or skipped beats.  She had similar symptoms in the past.  She had echocardiogram 2014 which was unremarkable.  She does drink minimal caffeine.  Usually 1 cup of coffee in the morning or 1 [facility].  No diagnosed history of hypertension.  She has had multiple elevated readings over the past year including here and at GYN office and has had readings as high as 169/94 this morning.  Her pulse has been up intermittently sometimes in the 80s and sometimes as high as 112.  She takes Armour Thyroid and is followed by endocrinologist [facility].  She states her last TSH was last summer and stable.  No recent headaches.  No chest pains.  No exertional dyspnea.  Has had some allergy symptoms recently with nasal congestion.    ROS: See pertinent positives and negatives per HPI.  Past Medical History:  Diagnosis Date  . Cancer [facility])    Thyroid    Past Surgical History:  Procedure Laterality Date  . APPENDECTOMY    . BREAST CYST ASPIRATION Right pt unsure   no visible scar  . CESAREAN SECTION  01/2004  . CESAREAN SECTION   10/2005  . INTRAUTERINE DEVICE INSERTION  06/26/2015   Mirena  . LASIK    . THYROIDECTOMY  07/2009   FOR THYROID CANCER; Dr Vicie Mutters    Family History  Problem Relation Age of Onset  . Hypertension Mother   . Hypertension Maternal Grandmother   . CAD Maternal Grandmother        CBAG in 81s; pacer  . Hyperlipidemia Father   . Cancer Neg Hx   . Diabetes Neg Hx   . Stroke Neg Hx   . Breast cancer Neg Hx     SOCIAL HX: Non-smoker.  No regular alcohol use.   Current Outpatient Medications:  .  ARMOUR THYROID 60 MG tablet, Take 120 mg by mouth daily. , Disp: , Rfl: 0 .  cevimeline (EVOXAC) 30 MG capsule, Take 30 mg by mouth 3 (three) times daily., Disp: , Rfl:  .  Cholecalciferol (VITAMIN D PO), Take 5,000 Units by mouth daily. , Disp: , Rfl:  .  lisdexamfetamine (VYVANSE) 40 MG capsule, Take one daily.  May refill on or after 02-12-19, Disp: 90 capsule, Rfl: 0 .  metoprolol succinate (TOPROL-XL) 50 MG 24 hr tablet, Take 1 tablet (50 mg total) by mouth daily. Take with or immediately following a meal., Disp: 30 tablet, Rfl: 5 .  Probiotic Product (PROBIOTIC DAILY PO), Take 1 tablet by mouth daily., Disp: , Rfl:  .  valACYclovir (VALTREX) 500 MG tablet, Take 1 tablet (500 mg total) by mouth 2 (two) times daily  as needed (Take 3-5 days for fever blisters)., Disp: 30 tablet, Rfl: 1  Current Facility-Administered Medications:  .  levonorgestrel (MIRENA) 20 MCG/24HR IUD, , Intrauterine, Once, Fontaine, Belinda Block, MD  EXAM:  VITALS per patient if applicable:  GENERAL: alert, oriented, appears well and in no acute distress  HEENT: atraumatic, conjunttiva clear, no obvious abnormalities on inspection of external nose and ears  NECK: normal movements of the head and neck  LUNGS: on inspection no signs of respiratory distress, breathing rate appears normal, no obvious gross SOB, gasping or wheezing  CV: no obvious cyanosis  MS: moves all visible extremities without noticeable  abnormality  PSYCH/NEURO: pleasant and cooperative, no obvious depression or anxiety, speech and thought processing grossly intact  ASSESSMENT AND PLAN:  Discussed the following assessment and plan:  #1 intermittent palpitations these are sometimes symptomatic but she has not had any worrisome features such as syncope, extreme dizziness, chest pain -Minimize caffeine use -Toprol-XL 50 mg 1 daily  #2 elevated blood pressure.  She has had several weeks of consistent readings mostly 051T systolic and 02T diastolic.  Currently untreated. -Start Toprol-XL 50 mg 1 daily -Discussed nonpharmacologic factors to help manage blood pressure including weight control and regular exercise -Recommend 83-month follow-up in office if possible to reassess blood pressure and further assess -She will continue to monitor at home in the meantime   I discussed the assessment and treatment plan with the patient. The patient was provided an opportunity to ask questions and all were answered. The patient agreed with the plan and demonstrated an understanding of the instructions.   The patient was advised to call back or seek an in-person evaluation if the symptoms worsen or if the condition fails to improve as anticipated.   Carolann Littler, MD '

## 2019-04-08 ENCOUNTER — Encounter: Payer: Self-pay | Admitting: Family Medicine

## 2019-04-08 ENCOUNTER — Other Ambulatory Visit: Payer: Self-pay

## 2019-04-08 ENCOUNTER — Ambulatory Visit: Payer: BLUE CROSS/BLUE SHIELD | Admitting: Family Medicine

## 2019-04-08 VITALS — BP 140/80 | HR 98 | Temp 98.2°F | Ht 70.0 in | Wt 180.0 lb

## 2019-04-08 DIAGNOSIS — R002 Palpitations: Secondary | ICD-10-CM

## 2019-04-08 DIAGNOSIS — R0789 Other chest pain: Secondary | ICD-10-CM | POA: Diagnosis not present

## 2019-04-08 DIAGNOSIS — I10 Essential (primary) hypertension: Secondary | ICD-10-CM | POA: Diagnosis not present

## 2019-04-08 NOTE — Progress Notes (Addendum)
Subjective:     Patient ID: Jennifer Shepard, female   DOB: 08-26-1966, 53 y.o.   MRN: [id number]  HPI Patient was seen by video visit last week for palpitations and elevated blood pressure.  We started Toprol-XL 50 mg once daily.  She states her blood pressure is somewhat improved and palpitations are slightly better.  However, she is still having some occasional irregularities.  She has sensation of occasional skipped beat.  No real chest pain.  She had some atypical chest symptoms which are nonexertional related.  As per previous note.  She had echocardiogram 2014 with no major structural heart problems.  She drinks minimal caffeine.  No regular alcohol.  Some increased stress recently.  No family history of premature CAD.  Non-smoker.  No history of diabetes.  Past Medical History:  Diagnosis Date  . Cancer [facility])    Thyroid   Past Surgical History:  Procedure Laterality Date  . APPENDECTOMY    . BREAST CYST ASPIRATION Right pt unsure   no visible scar  . CESAREAN SECTION  01/2004  . CESAREAN SECTION  10/2005  . INTRAUTERINE DEVICE INSERTION  06/26/2015   Mirena  . LASIK    . THYROIDECTOMY  07/2009   FOR THYROID CANCER; Dr Vicie Mutters    reports that she has never smoked. She has never used smokeless tobacco. She reports current alcohol use. She reports that she does not use drugs. family history includes CAD in her maternal grandmother; Hyperlipidemia in her father; Hypertension in her maternal grandmother and mother. Allergies  Allergen Reactions  . Cefaclor Other (See Comments)    Angioedema of lips  . Morphine And Related Other (See Comments)    Severe headache  . Tyloxapol   . Oxycodone-Acetaminophen Itching    itching     Review of Systems  Constitutional: Negative for appetite change, chills, fever and unexpected weight change.  Respiratory: Negative for cough, shortness of breath and wheezing.   Cardiovascular: Positive for palpitations. Negative for leg swelling.   Gastrointestinal: Negative for abdominal pain.  Neurological: Negative for dizziness and weakness.       Objective:   Physical Exam Constitutional:      Appearance: She is well-developed.  Cardiovascular:     Rate and Rhythm: Normal rate and regular rhythm.     Heart sounds: Normal heart sounds. No systolic murmur.  Pulmonary:     Effort: Pulmonary effort is normal. No tachypnea or respiratory distress.     Breath sounds: Normal breath sounds.  Musculoskeletal:     Right lower leg: No edema.     Left lower leg: No edema.  Neurological:     Mental Status: She is alert.        Assessment:     #1 palpitations.  Patient did have occasional irregularity on auscultation and suspect she is having occasional PVCs or PACs.  None were captured on EKG which shows sinus rhythm  #2 hypertension.  Recent initiation of Toprol-XL 50 mg daily.  Repeat blood pressure after rest left arm seated 140/80 which is improved compared to her recent readings    Plan:     -Continue to monitor blood pressure closely and be in touch if consistently greater than 140/90 over the next few weeks  -We discussed previously nonpharmacologic factors that affect blood pressure and of advised regular exercise such as walking and weight control  -Continue to minimize caffeine intake  -Continue Toprol-XL 50 mg daily  -We discussed other possible evaluation such as  event monitoring but at this point have elected to observe.  Patient did request consideration for cardiology referral and we can set this up  Eulas Post MD [facility]  04-18-19 addendum:  Pt called back with persistent palpitations and wants to proceed with cardiac event monitor.  Eulas Post MD [facility]

## 2019-04-08 NOTE — Patient Instructions (Signed)
Palpitations  Palpitations are feelings that your heartbeat is irregular or is faster than normal. It may feel like your heart is fluttering or skipping a beat. Palpitations are usually not a serious problem. They may be caused by many things, including smoking, caffeine, alcohol, stress, and certain medicines or drugs. Most causes of palpitations are not serious. However, some palpitations can be a sign of a serious problem. You may need further tests to rule out serious medical problems.  Follow these instructions at home:         Pay attention to any changes in your condition. Take these actions to help manage your symptoms:  Eating and drinking  Avoid foods and drinks that may cause palpitations. These may include:  Caffeinated coffee, tea, soft drinks, diet pills, and energy drinks.  Chocolate.  Alcohol.  Lifestyle  Take steps to reduce your stress and anxiety. Things that can help you relax include:  Yoga.  Mind-body activities, such as deep breathing, meditation, or using words and images to create positive thoughts (guided imagery).  Physical activity, such as swimming, jogging, or walking. Tell your health care provider if your palpitations increase with activity. If you have chest pain or shortness of breath with activity, do not continue the activity until you are seen by your health care provider.  Biofeedback. This is a method that helps you learn to use your mind to control things in your body, such as your heartbeat.  Do not use drugs, including cocaine or ecstasy. Do not use marijuana.  Get plenty of rest and sleep. Keep a regular bed time.  General instructions  Take over-the-counter and prescription medicines only as told by your health care provider.  Do not use any products that contain nicotine or tobacco, such as cigarettes and e-cigarettes. If you need help quitting, ask your health care provider.  Keep all follow-up visits as told by your health care provider. This is important. These may  include visits for further testing if palpitations do not go away or get worse.  Contact a health care provider if you:  Continue to have a fast or irregular heartbeat after 24 hours.  Notice that your palpitations occur more often.  Get help right away if you:  Have chest pain or shortness of breath.  Have a severe headache.  Feel dizzy or you faint.  Summary  Palpitations are feelings that your heartbeat is irregular or is faster than normal. It may feel like your heart is fluttering or skipping a beat.  Palpitations may be caused by many things, including smoking, caffeine, alcohol, stress, certain medicines, and drugs.  Although most causes of palpitations are not serious, some causes can be a sign of a serious medical problem.  Get help right away if you faint or have chest pain, shortness of breath, a severe headache, or dizziness.  This information is not intended to replace advice given to you by your health care provider. Make sure you discuss any questions you have with your health care provider.  Document Released: 12/08/2000 Document Revised: 01/23/2018 Document Reviewed: 01/23/2018  Elsevier Interactive Patient Education  2019 [employer]vier Inc[employer]

## 2019-04-15 ENCOUNTER — Encounter: Payer: Self-pay | Admitting: Family Medicine

## 2019-04-18 NOTE — Addendum Note (Signed)
Addended by: Eulas Post on: 04/18/2019 12:08 PM   Modules accepted: Orders

## 2019-04-24 ENCOUNTER — Encounter: Payer: BLUE CROSS/BLUE SHIELD | Admitting: Cardiology

## 2019-04-24 ENCOUNTER — Telehealth: Payer: Self-pay

## 2019-04-24 ENCOUNTER — Other Ambulatory Visit: Payer: Self-pay

## 2019-04-24 ENCOUNTER — Encounter: Payer: Self-pay | Admitting: Cardiology

## 2019-04-24 DIAGNOSIS — I10 Essential (primary) hypertension: Secondary | ICD-10-CM | POA: Insufficient documentation

## 2019-04-24 NOTE — Progress Notes (Signed)
Virtual Visit via Video Note   This visit type was conducted due to national recommendations for restrictions regarding the COVID-19 Pandemic (e.g. social distancing) in an effort to limit this patient's exposure and mitigate transmission in our community.  Due to her co-morbid illnesses, this patient is at least at moderate risk for complications without adequate follow up.  This format is felt to be most appropriate for this patient at this time.  All issues noted in this document were discussed and addressed.  A limited physical exam was performed with this format.  Please refer to the patient's chart for her consent to telehealth for [facility].   Evaluation Performed:  Cardiology consult  This visit type was conducted due to national recommendations for restrictions regarding the COVID-19 Pandemic (e.g. social distancing).  This format is felt to be most appropriate for this patient at this time.  All issues noted in this document were discussed and addressed.  No physical exam was performed (except for noted visual exam findings with Video Visits).  Please refer to the patient's chart (MyChart message for video visits and phone note for telephone visits) for the patient's consent to telehealth for [facility].  Date: 04/25/2019   ID:  Jennifer Shepard, DOB April 03, 1966, MRN [id number]  Patient Location:  Home  Provider location:   [place]  PCP:  Eulas Post, MD  Cardiologist:  NEW Electrophysiologist:  None   Chief Complaint:  Palpitations  History of Present Illness:    Jennifer Shepard is a 53 y.o. female who presents via audio/video conferencing for a telehealth visit today in referral by Dr. Carolann Littler for evaluation of palpitations.    This is a 53 year old female with a history of hypertension and thyroid cancer who recently presented to her PCP with complaints of palpitations.  She tells me that her palpitations are very sporadic and occur a few times per  day.  She was started on Toprol and is now on 50 mg daily which is helped some but she still gets the palpitations.  She says she will drink 1 cup of coffee or [facility] in the morning although she has recently quit the [facility].  She does drink 4-6 beers nightly.  She also says that she occasionally has an uncomfortable feeling in her chest that she really cannot describe but is not her palpitations.  It occurs every few days.  She says it can last hours at a time.  There is no radiation of the discomfort and is midsternal in location.  There were no associated symptoms of nausea, diaphoresis or shortness of breath.  She denies any lower extremity edema, PND, orthopnea, dyspnea on exertion, dizziness or syncope.  The patient does not have symptoms concerning for COVID-19 infection (fever, chills, cough, or new shortness of breath).    Prior CV studies:   The following studies were reviewed today:  none  Past Medical History:  Diagnosis Date  . Benign essential HTN   . Cancer [facility])    Thyroid   Past Surgical History:  Procedure Laterality Date  . APPENDECTOMY    . BREAST CYST ASPIRATION Right pt unsure   no visible scar  . CESAREAN SECTION  01/2004  . CESAREAN SECTION  10/2005  . INTRAUTERINE DEVICE INSERTION  06/26/2015   Mirena  . LASIK    . THYROIDECTOMY  07/2009   FOR THYROID CANCER; Dr Vicie Mutters     Current Meds  Medication Sig  . ARMOUR THYROID  60 MG tablet Take 120 mg by mouth daily.   . cevimeline (EVOXAC) 30 MG capsule Take 30 mg by mouth 3 (three) times daily.  . Cholecalciferol (VITAMIN D PO) Take 5,000 Units by mouth daily.   Marland Kitchen lisdexamfetamine (VYVANSE) 40 MG capsule Take one daily.  May refill on or after 02-12-19  . metoprolol succinate (TOPROL-XL) 50 MG 24 hr tablet Take 1 tablet (50 mg total) by mouth daily. Take with or immediately following a meal.  . Probiotic Product (PROBIOTIC DAILY PO) Take 1 tablet by mouth daily.  . valACYclovir (VALTREX) 500 MG tablet  Take 1 tablet (500 mg total) by mouth 2 (two) times daily as needed (Take 3-5 days for fever blisters).   Current Facility-Administered Medications for the 04/25/19 encounter (Telemedicine) with Sueanne Margarita, MD  Medication  . levonorgestrel (MIRENA) 20 MCG/24HR IUD     Allergies:   Cefaclor; Morphine and related; Tyloxapol; and Oxycodone-acetaminophen   Social History   Tobacco Use  . Smoking status: Never Smoker  . Smokeless tobacco: Never Used  Substance Use Topics  . Alcohol use: Yes    Comment: SOCIALLY ONLY  . Drug use: No     Family Hx: The patient's family history includes CAD in her maternal grandmother; Hyperlipidemia in her father; Hypertension in her maternal grandmother and mother. There is no history of Cancer, Diabetes, Stroke, or Breast cancer.  ROS:   Please see the history of present illness.     All other systems reviewed and are negative.   Labs/Other Tests and Data Reviewed:    Recent Labs: 11/25/2018: ALT 21; BUN 10; Creat 0.64; Hemoglobin 13.3; Platelets 310; Potassium 4.2; Sodium 136   Recent Lipid Panel Lab Results  Component Value Date/Time   CHOL 214 (H) 11/25/2018 11:58 AM   TRIG 49 11/25/2018 11:58 AM   HDL 98 11/25/2018 11:58 AM   CHOLHDL 2.2 11/25/2018 11:58 AM   LDLCALC 102 (H) 11/25/2018 11:58 AM    Wt Readings from Last 3 Encounters:  04/25/19 180 lb (81.6 kg)  04/24/19 180 lb (81.6 kg)  04/08/19 180 lb (81.6 kg)     Objective:    Vital Signs:  BP (!) 160/94   Pulse 83   Ht 5\' 9"  (1.753 m)   Wt 180 lb (81.6 kg)   BMI 26.58 kg/m    CONSTITUTIONAL:  Well nourished, well developed female in no acute distress.  EYES: anicteric MOUTH: oral mucosa is pink RESPIRATORY: Normal respiratory effort, symmetric expansion CARDIOVASCULAR: No peripheral edema SKIN: No rash, lesions or ulcers MUSCULOSKELETAL: no digital cyanosis NEURO: Cranial Nerves II-XII grossly intact, moves all extremities PSYCH: Intact judgement and insight.   A&O x 3, Mood/affect appropriate   ASSESSMENT & PLAN:    1.  Palpitations - these are sporadic and of unclear etiology.  Twelve-lead EKG showed normal sinus rhythm with incomplete right bundle branch block and normal intervals.  She had a normal 2D echocardiogram in 2014.  She apparently had an event monitor ordered by her PCP but has not gotten a call to come into the office to get it done yet.  This is likely been delayed because of the COVID-19 crisis.  I am going to cancel that order and we will get a long-term ZIO patch monitor to evaluate for arrhythmias.  I have encouraged her to cut out the caffeine and also decrease the amount of alcohol she is drinking on a nightly basis which is likely triggering some of these arrhythmias.  Given  the significant amount of alcohol she drinks on a nightly basis I recommended getting a 2D echocardiogram to make sure LV function is normal.  2.  Hypertension - her BP is poorly controlled on exam today.  I have recommended increasing Toprol XL to 75mg  daily.  I have asked her to check her BP daily for a week and call with results.   3.  Chest pain -this is somewhat atypical in that there were no associated symptoms of nausea, diaphoresis or shortness of breath and there is no radiation of discomfort.  She has a difficult time explaining it.  It can last for hours at a time but is sporadic in occurrence and may go several days without having any.  It is nonexertional.  Her EKG was normal.  I recommended getting a coronary calcium score to start off since we are holding off on exercise treadmill test now due to COVID-19.  If her calcium score is 0 then would treat her first for the palpitations and see if her symptoms go away.  If she continues to have chest discomfort we may need to do a coronary CTA with morphology.  4.  COVID-19 Education:The signs and symptoms of COVID-19 were discussed with the patient and how to seek care for testing (follow up with PCP or  arrange E-visit).  The importance of social distancing was discussed today.  Patient Risk:   After full review of this patient's clinical status, I feel that they are at least moderate risk at this time.  Time:   Today, I have spent 46minutes directly with the patient on video discussing medical problems including palpitations and HTN.  We also reviewed the symptoms of COVID 19 and the ways to protect against contracting the virus with telehealth technology.  I spent an additional 5 minutes reviewing patient's chart including office notes from PCP.  Medication Adjustments/Labs and Tests Ordered: Current medicines are reviewed at length with the patient today.  Concerns regarding medicines are outlined above.  Tests Ordered: No orders of the defined types were placed in this encounter.  Medication Changes: No orders of the defined types were placed in this encounter.   Disposition:  Follow up prn  Signed, Fransico Him, MD  04/25/2019 1:28 PM    [place]

## 2019-04-24 NOTE — Telephone Encounter (Signed)

## 2019-04-24 NOTE — Progress Notes (Signed)
erroroneous encounter  This encounter was created in error - please disregard.

## 2019-04-25 ENCOUNTER — Telehealth (INDEPENDENT_AMBULATORY_CARE_PROVIDER_SITE_OTHER): Payer: BLUE CROSS/BLUE SHIELD | Admitting: Cardiology

## 2019-04-25 ENCOUNTER — Other Ambulatory Visit: Payer: Self-pay

## 2019-04-25 ENCOUNTER — Encounter: Payer: Self-pay | Admitting: Cardiology

## 2019-04-25 VITALS — BP 160/94 | HR 83 | Ht 69.0 in | Wt 180.0 lb

## 2019-04-25 DIAGNOSIS — R079 Chest pain, unspecified: Secondary | ICD-10-CM

## 2019-04-25 DIAGNOSIS — I10 Essential (primary) hypertension: Secondary | ICD-10-CM

## 2019-04-25 DIAGNOSIS — R002 Palpitations: Secondary | ICD-10-CM

## 2019-04-25 DIAGNOSIS — R0789 Other chest pain: Secondary | ICD-10-CM

## 2019-04-25 DIAGNOSIS — Z7189 Other specified counseling: Secondary | ICD-10-CM | POA: Diagnosis not present

## 2019-04-25 MED ORDER — METOPROLOL SUCCINATE ER 25 MG PO TB24: 75.0000 mg | ORAL_TABLET | Freq: Every day | ORAL | 3 refills | Status: DC

## 2019-04-25 NOTE — Patient Instructions (Addendum)
Medication Instructions:  Increase: Toprol 75 mg, daily, by mouth  If you need a refill on your cardiac medications before your next appointment, please call your pharmacy.   Lab work: None If you have labs (blood work) drawn today and your tests are completely normal, you will receive your results only by: Marland Kitchen MyChart Message (if you have MyChart) OR . A paper copy in the mail If you have any lab test that is abnormal or we need to change your treatment, we will call you to review the results.  Testing/Procedures: Zio Patch, 14 day monitor  Chest CT Calcium Score in June   Your physician has requested that you have an echocardiogram around June. Echocardiography is a painless test that uses sound waves to create images of your heart. It provides your doctor with information about the size and shape of your heart and how well your heart's chambers and valves are working. This procedure takes approximately one hour. There are no restrictions for this procedure.  Follow-Up: After results of testing

## 2019-04-28 ENCOUNTER — Telehealth: Payer: Self-pay | Admitting: *Deleted

## 2019-04-28 NOTE — Telephone Encounter (Signed)
[employer] to ship a 30 day cardiac event monitor to the patients home per Dr. Anastasio Auerbach' order.  Dr. Radford Pax had ordered a long term live monitor because of the patch convenience, however the patients [employer] does not cover long term live telemetry.  Explained to the patient, the [employer] monitor is also a patch monitor.  Instructions reviewed briefly as they are included in the kit.

## 2019-05-02 ENCOUNTER — Encounter (INDEPENDENT_AMBULATORY_CARE_PROVIDER_SITE_OTHER): Payer: BLUE CROSS/BLUE SHIELD

## 2019-05-02 DIAGNOSIS — R002 Palpitations: Secondary | ICD-10-CM | POA: Diagnosis not present

## 2019-05-02 DIAGNOSIS — R0789 Other chest pain: Secondary | ICD-10-CM

## 2019-05-06 DIAGNOSIS — H04121 Dry eye syndrome of right lacrimal gland: Secondary | ICD-10-CM | POA: Diagnosis not present

## 2019-05-06 DIAGNOSIS — H04122 Dry eye syndrome of left lacrimal gland: Secondary | ICD-10-CM | POA: Diagnosis not present

## 2019-05-07 ENCOUNTER — Other Ambulatory Visit: Payer: Self-pay | Admitting: Family Medicine

## 2019-05-08 ENCOUNTER — Encounter: Payer: Self-pay | Admitting: Family Medicine

## 2019-05-08 MED ORDER — LISDEXAMFETAMINE DIMESYLATE 40 MG PO CAPS: ORAL_CAPSULE | ORAL | 0 refills | Status: DC

## 2019-05-08 NOTE — Telephone Encounter (Signed)
Last OV 04/08/19, No future OV  Last filled 01/13/19, # 90 with 0 refills

## 2019-05-12 ENCOUNTER — Encounter: Payer: Self-pay | Admitting: Women's Health

## 2019-05-16 ENCOUNTER — Telehealth: Payer: Self-pay | Admitting: Cardiology

## 2019-05-16 NOTE — Telephone Encounter (Signed)
New Message   Patient has heart monitor and wants to know how long she's suppose to wear it for.

## 2019-05-16 NOTE — Telephone Encounter (Signed)
Spoke with the patient, she expressed understanding about her monitor duration.

## 2019-05-29 ENCOUNTER — Telehealth (HOSPITAL_COMMUNITY): Payer: Self-pay | Admitting: Cardiology

## 2019-05-29 ENCOUNTER — Other Ambulatory Visit: Payer: Self-pay

## 2019-05-29 NOTE — Telephone Encounter (Signed)

## 2019-05-30 ENCOUNTER — Other Ambulatory Visit: Payer: Self-pay

## 2019-05-30 ENCOUNTER — Ambulatory Visit (HOSPITAL_COMMUNITY): Payer: BLUE CROSS/BLUE SHIELD | Attending: Cardiology

## 2019-05-30 ENCOUNTER — Ambulatory Visit (INDEPENDENT_AMBULATORY_CARE_PROVIDER_SITE_OTHER)
Admission: RE | Admit: 2019-05-30 | Discharge: 2019-05-30 | Disposition: A | Discharge status: Home / Self Care | Payer: Self-pay | Source: Ambulatory Visit | Attending: Cardiology | Admitting: Cardiology

## 2019-05-30 ENCOUNTER — Telehealth: Payer: Self-pay | Admitting: Cardiology

## 2019-05-30 DIAGNOSIS — R079 Chest pain, unspecified: Secondary | ICD-10-CM

## 2019-05-30 DIAGNOSIS — R0789 Other chest pain: Secondary | ICD-10-CM

## 2019-05-30 NOTE — Telephone Encounter (Signed)
Patient called this evening concerned by results of her recent echocardiogram. I reviewed the results with the patient, which included normal biventricular function with early diastolic dysfunction and some asymmetrical LVH. She was also concerned by her blood pressure, which remains elevated (SBP ~ 170s). She denied head ache, new chest pain or pressure, or worsening SOB. I advised her to reach out to Dr. Theodosia Blender office next week to the discuss the next steps for management of her hypertension.   Patient in agreement.   [name], MD

## 2019-05-31 NOTE — Telephone Encounter (Signed)
Please have her check her HR and BP daily for a week 2 hours after taking BP meds and call with results

## 2019-06-02 DIAGNOSIS — I10 Essential (primary) hypertension: Secondary | ICD-10-CM

## 2019-06-02 NOTE — Telephone Encounter (Signed)
Spoke with the patient, she is going to [place] over her blood pressure readings.

## 2019-06-04 MED ORDER — LOSARTAN POTASSIUM 25 MG PO TABS: 25.0000 mg | ORAL_TABLET | Freq: Every day | ORAL | 3 refills | Status: DC

## 2019-06-04 NOTE — Telephone Encounter (Signed)
Spoke with the patient, she expressed understanding about taking Losartan and will track her blood pressure. She is coming in on 6/18 for a BMET.

## 2019-06-12 ENCOUNTER — Telehealth: Payer: Self-pay

## 2019-06-12 ENCOUNTER — Other Ambulatory Visit: Payer: BLUE CROSS/BLUE SHIELD

## 2019-06-12 ENCOUNTER — Encounter: Payer: Self-pay | Admitting: Family Medicine

## 2019-06-12 NOTE — Telephone Encounter (Signed)
    COVID-19 Pre-Screening Questions:  . In the past 7 to 10 days have you had a cough,  shortness of breath, headache, congestion, fever (100 or greater) body aches, chills, sore throat, or sudden loss of taste or sense of smell? . Have you been around anyone with known Covid 19. . Have you been around anyone who is awaiting Covid 19 test results in the past 7 to 10 days? . Have you been around anyone who has been exposed to Covid 19, or has mentioned symptoms of Covid 19 within the past 7 to 10 days?  If you have any concerns/questions about symptoms patients report during screening (either on the phone or at threshold). Contact the provider seeing the patient or DOD for further guidance.  If neither are available contact a member of the leadership team.         Pt answered NO to all questions. kb

## 2019-06-12 NOTE — Telephone Encounter (Signed)
She takes Losartan 25 mg, daily at 7:30 am and 75 mg Toprol at 1:30 pm.

## 2019-06-13 ENCOUNTER — Other Ambulatory Visit: Payer: BC Managed Care – PPO | Admitting: *Deleted

## 2019-06-13 ENCOUNTER — Other Ambulatory Visit: Payer: Self-pay

## 2019-06-13 DIAGNOSIS — I10 Essential (primary) hypertension: Secondary | ICD-10-CM | POA: Diagnosis not present

## 2019-06-14 LAB — BASIC METABOLIC PANEL
BUN/Creatinine Ratio: 15 (ref 9–23)
BUN: 9 mg/dL (ref 6–24)
CO2: 26 mmol/L (ref 20–29)
Calcium: 9.2 mg/dL (ref 8.7–10.2)
Chloride: 101 mmol/L (ref 96–106)
Creatinine, Ser: 0.59 mg/dL (ref 0.57–1.00)
GFR calc Af Amer: 123 mL/min/{1.73_m2} (ref >59)
GFR calc non Af Amer: 106 mL/min/{1.73_m2} (ref >59)
Glucose: 79 mg/dL (ref 65–99)
Potassium: 3.6 mmol/L (ref 3.5–5.2)
Sodium: 141 mmol/L (ref 134–144)

## 2019-06-16 NOTE — Telephone Encounter (Signed)
The patient stated she was taking 100 mg of Toprol and she still is having elevated blood pressure. Her palpitations have improved but she blood pressure still remains in the 376E systolic. Today her pressure was 140/85, HR 115 and yesterday: 163/104 and HR: 94.

## 2019-06-18 NOTE — Telephone Encounter (Signed)
The patient is concerned about her elevated blood pressure and heart rate. She has taken her medication and it has not made a significant change. She still runs in the 932T systolic. I advised her to contact PCP to look into alternatives for Vyvanse.

## 2019-07-08 ENCOUNTER — Telehealth: Payer: Self-pay

## 2019-07-08 NOTE — Telephone Encounter (Signed)
Spoke with the patient, she expressed understanding about adjusting the timing of her medications. She will send blood pressure values in a week.

## 2019-07-08 NOTE — Telephone Encounter (Signed)
I would recommend taking her losartan and the lopressor first thing in the am togethter.  Check BP 2 hours later daily for a week and call with the results of Bp and HR

## 2019-07-08 NOTE — Telephone Encounter (Signed)
Spoke with the patient, she needs a refill on her Metoprolol. She currently takes 100 mg daily and losartan 25 mg daily. She takes her losartan at 7-8 am and takes her metoprolol around 1:30 pm. She said that Dr. Elease Hashimoto stated to take metoprolol by itself. She still is having high blood pressure around the 947M systolic, she takes the BP at 11:30 am. She states her palpitations are better but she still have elevated blood pressure. She wanted to know what medication changes could be made.

## 2019-08-11 ENCOUNTER — Telehealth: Payer: Self-pay | Admitting: Family Medicine

## 2019-08-11 MED ORDER — LISDEXAMFETAMINE DIMESYLATE 40 MG PO CAPS: ORAL_CAPSULE | ORAL | 0 refills | Status: DC

## 2019-08-11 NOTE — Telephone Encounter (Signed)
See request °

## 2019-08-11 NOTE — Telephone Encounter (Signed)
Last OV 04/08/19, No future OV  Last filled 05/08/19, # 90 with 0 refills

## 2019-08-11 NOTE — Telephone Encounter (Signed)
Copied from [place] [id number]. Topic: Quick Communication - Rx Refill/Question >> Aug 11, 2019  9:01 AM Parke Poisson wrote: Medication:lisdexamfetamine (VYVANSE) 40 MG capsule  Has the patient contacted their pharmacy? No (Agent: If no, request that the patient contact the pharmacy for the refill.) She said it never works when she calls the pharmacy   Preferred Pharmacy (with phone number or street name):[facility] [phone] - [place], [place] [phone] (Phone) [phone] (Fax)    Agent: Please be advised that RX refills may take up to 3 business days. We ask that you follow-up with your pharmacy.

## 2019-08-19 ENCOUNTER — Telehealth: Payer: Self-pay

## 2019-08-19 ENCOUNTER — Other Ambulatory Visit: Payer: Self-pay

## 2019-08-19 ENCOUNTER — Telehealth (INDEPENDENT_AMBULATORY_CARE_PROVIDER_SITE_OTHER): Payer: BC Managed Care – PPO | Admitting: Family Medicine

## 2019-08-19 ENCOUNTER — Encounter: Payer: Self-pay | Admitting: Family Medicine

## 2019-08-19 ENCOUNTER — Ambulatory Visit: Payer: Self-pay | Admitting: *Deleted

## 2019-08-19 DIAGNOSIS — T25222A Burn of second degree of left foot, initial encounter: Secondary | ICD-10-CM

## 2019-08-19 NOTE — Telephone Encounter (Signed)
   Reason for Disposition . Minor thermal burn  Additional Information . Negative: [1] Minor thermal burn AND [2] last tetanus shot > 10 years ago    Patient states last tetanus shot was less than 10 years ago.  States she had since moving into her home in 2012, because she had a cut while moving and got tetanus shot.  Answer Assessment - Initial Assessment Questions 1. ONSET: "When did it happen?" If happened <3 hours ago, ask: "Did you apply cold water?" If not, give First Aid Advice immediately.      Last night -Ran under cold water and used ice pack  2. LOCATION: "Where is the burn located?"      Top of left foot  3. BURN SIZE: "How large is the burn?"  The palm is roughly 1% of the total body surface area (BSA).     Less than 1% of BSA- 1.5 inch  4. SEVERITY OF THE BURN: "Are there any blisters?"      One larger blister  5. MECHANISM: "Tell me how it happened."    Boiling water splashed onto foot   6. PAIN: "Are you having any pain?" "How bad is the pain?" (Scale 1-10; or mild, moderate, severe)   - MILD (1-3): doesn't interfere with normal activities    - MODERATE (4-7): interferes with normal activities or awakens from sleep    - SEVERE (8-10): excruciating pain, unable to do any normal activities      Moderate- 5 with movement   7. INHALATION INJURY: "Were you exposed to any smoke or fumes?" If yes: "Do you have any cough or difficulty breathing?"     N/A 8. OTHER SYMPTOMS: "Do you have any other symptoms?" (e.g., headache, nausea)     None 9. PREGNANCY: "Is there any chance you are pregnant?" "When was your last menstrual period?"     N/A  Protocols used: [facility]  Patient states she was boiling water last night and small amount splashed out of the pot onto the top of her left foot.  She states she ran cold water on the burn and held an ice pack to the area immediately after the burn.  Today she states there is about a 1.5 inch blister with redness around it  this morning.  She states she is not in pain when sitting, but has moderate pain 5/10 with moving foot. She denies any pus drainage from burn, or fever, and she is not diabetic.  She states she has had a tetanus shot within the past 10 years.  States she is sure of this because she got the vaccine when moving into her home in 2012 because she experienced a cut.  Home care instructions reviewed for burn.  Advised patient of signs and symptoms of infection.  Advised her to call back if burn not healing well or if there are any signs and symptoms of infection. Patient expressed understanding and is agreeable with plan.
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
## 2019-08-19 NOTE — Telephone Encounter (Signed)
Called patient and left a voice message to let her know that the cream is ready for pick up at the front desk and to please wear a mask and call the number before coming in the building.

## 2019-08-19 NOTE — Telephone Encounter (Signed)
Called patient and she wanted a virtual visit today at 3pm. Patient last had a Tdap in 2016.

## 2019-08-19 NOTE — Telephone Encounter (Signed)
We can give her option.   If she wants [place] to look in office OK with me.  We might be able to see OK on Doxy.  Sounds like she will not need a tetanus.

## 2019-08-19 NOTE — Telephone Encounter (Signed)
Do you want in office visit? Or Doxy?

## 2019-08-21 ENCOUNTER — Emergency Department (HOSPITAL_COMMUNITY)
Admission: EM | Admit: 2019-08-21 | Discharge: 2019-08-21 | Disposition: A | Discharge status: Home / Self Care | Payer: BC Managed Care – PPO | Attending: Emergency Medicine | Admitting: Emergency Medicine

## 2019-08-21 ENCOUNTER — Encounter (HOSPITAL_COMMUNITY): Payer: Self-pay

## 2019-08-21 ENCOUNTER — Other Ambulatory Visit: Payer: Self-pay

## 2019-08-21 DIAGNOSIS — T25222A Burn of second degree of left foot, initial encounter: Secondary | ICD-10-CM | POA: Insufficient documentation

## 2019-08-21 DIAGNOSIS — Z79899 Other long term (current) drug therapy: Secondary | ICD-10-CM | POA: Diagnosis not present

## 2019-08-21 DIAGNOSIS — Y939 Activity, unspecified: Secondary | ICD-10-CM | POA: Insufficient documentation

## 2019-08-21 DIAGNOSIS — Y999 Unspecified external cause status: Secondary | ICD-10-CM | POA: Insufficient documentation

## 2019-08-21 DIAGNOSIS — E039 Hypothyroidism, unspecified: Secondary | ICD-10-CM | POA: Diagnosis not present

## 2019-08-21 DIAGNOSIS — X12XXXA Contact with other hot fluids, initial encounter: Secondary | ICD-10-CM | POA: Diagnosis not present

## 2019-08-21 DIAGNOSIS — I10 Essential (primary) hypertension: Secondary | ICD-10-CM | POA: Diagnosis not present

## 2019-08-21 DIAGNOSIS — Y929 Unspecified place or not applicable: Secondary | ICD-10-CM | POA: Diagnosis not present

## 2019-08-21 DIAGNOSIS — H04123 Dry eye syndrome of bilateral lacrimal glands: Secondary | ICD-10-CM | POA: Diagnosis not present

## 2019-08-21 DIAGNOSIS — T310 Burns involving less than 10% of body surface: Secondary | ICD-10-CM | POA: Diagnosis not present

## 2019-08-21 DIAGNOSIS — T25022A Burn of unspecified degree of left foot, initial encounter: Secondary | ICD-10-CM | POA: Diagnosis not present

## 2019-08-21 MED ORDER — SILVER SULFADIAZINE 1 % EX CREA
TOPICAL_CREAM | Freq: Once | CUTANEOUS | Status: AC
  Administered 2019-08-21: 1 via TOPICAL
  Filled 2019-08-21: qty 85

## 2019-08-21 MED ORDER — SILVER SULFADIAZINE 1 % EX CREA: 1.0000 "application " | TOPICAL_CREAM | Freq: Every day | CUTANEOUS | 0 refills | Status: DC

## 2019-08-21 NOTE — Discharge Instructions (Signed)
Continue using silvadene cream twice daily, cleaning with gentle soap and water. Extra tube of silvadene cream sent to your pharmacy if needed. Monitor for any redness extending to the rest of the foot, ankle, or leg, fever, etc. Follow-up with your primary care doctor. Return to the ED for new or worsening symptoms.
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
## 2019-08-22 ENCOUNTER — Encounter: Payer: Self-pay | Admitting: Family Medicine

## 2019-08-29 ENCOUNTER — Encounter: Payer: Self-pay | Admitting: Family Medicine

## 2019-08-29 ENCOUNTER — Ambulatory Visit (INDEPENDENT_AMBULATORY_CARE_PROVIDER_SITE_OTHER): Payer: BC Managed Care – PPO | Admitting: Family Medicine

## 2019-08-29 ENCOUNTER — Other Ambulatory Visit: Payer: Self-pay

## 2019-08-29 DIAGNOSIS — T25222D Burn of second degree of left foot, subsequent encounter: Secondary | ICD-10-CM

## 2019-08-29 DIAGNOSIS — M25472 Effusion, left ankle: Secondary | ICD-10-CM | POA: Diagnosis not present

## 2019-08-29 MED ORDER — FUROSEMIDE 20 MG PO TABS: 20.0000 mg | ORAL_TABLET | Freq: Every day | ORAL | 0 refills | Status: DC

## 2019-08-29 MED ORDER — POTASSIUM CHLORIDE CRYS ER 20 MEQ PO TBCR: 20.0000 meq | EXTENDED_RELEASE_TABLET | Freq: Every day | ORAL | 0 refills | Status: DC
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
## 2019-09-03 ENCOUNTER — Telehealth: Payer: Self-pay

## 2019-09-03 ENCOUNTER — Other Ambulatory Visit: Payer: Self-pay

## 2019-09-03 DIAGNOSIS — I10 Essential (primary) hypertension: Secondary | ICD-10-CM

## 2019-09-03 DIAGNOSIS — R002 Palpitations: Secondary | ICD-10-CM

## 2019-09-03 DIAGNOSIS — Z79899 Other long term (current) drug therapy: Secondary | ICD-10-CM

## 2019-09-03 MED ORDER — METOPROLOL SUCCINATE ER 100 MG PO TB24: 100.0000 mg | ORAL_TABLET | Freq: Every day | ORAL | 3 refills | Status: DC

## 2019-09-03 NOTE — Telephone Encounter (Signed)
I spoke to the patient with Dr Theodosia Blender recommendations.  She said that she has been taking Metoprolol Succinate 100 mg daily, already, that was recently increased, but not changed on list.  We arranged labs and Renal Duplex.  Please advise on medication.  Thank you

## 2019-09-03 NOTE — Telephone Encounter (Signed)
Increase Losartan to 50mg  daily and check Bp daily for a week and call and needs BMET in1 week

## 2019-09-04 ENCOUNTER — Other Ambulatory Visit: Payer: BC Managed Care – PPO | Admitting: *Deleted

## 2019-09-04 ENCOUNTER — Other Ambulatory Visit: Payer: Self-pay

## 2019-09-04 DIAGNOSIS — I10 Essential (primary) hypertension: Secondary | ICD-10-CM | POA: Diagnosis not present

## 2019-09-04 MED ORDER — LOSARTAN POTASSIUM 50 MG PO TABS: 50.0000 mg | ORAL_TABLET | Freq: Every day | ORAL | 3 refills | Status: DC

## 2019-09-04 NOTE — Telephone Encounter (Signed)
Pt verbalized understanding to increase her Losartan to 50mg  a day and will keep track of her BP for the next week and have repeat labs in one week.

## 2019-09-04 NOTE — Telephone Encounter (Signed)
LMTCB

## 2019-09-04 NOTE — Addendum Note (Signed)
Addended by: Stephani Police on: 09/04/2019 10:13 AM   Modules accepted: Orders

## 2019-09-05 ENCOUNTER — Telehealth: Payer: Self-pay

## 2019-09-05 ENCOUNTER — Other Ambulatory Visit: Payer: BC Managed Care – PPO

## 2019-09-05 NOTE — Telephone Encounter (Signed)
Notes recorded by Frederik Schmidt, RN on 09/05/2019 at 10:50 AM EDT  Lpm with results 9/11  ------

## 2019-09-05 NOTE — Telephone Encounter (Signed)
-----   Message from Sueanne Margarita, MD sent at 09/05/2019 10:11 AM EDT ----- Please let patient know that labs were normal.  Continue current medical therapy.

## 2019-09-09 LAB — ALDOSTERONE + RENIN ACTIVITY W/ RATIO
ALDOS/RENIN RATIO: >50.9 — ABNORMAL HIGH (ref 0.0–30.0)
ALDOSTERONE: 8.5 ng/dL (ref 0.0–30.0)
Renin: <0.167 ng/mL/hr — ABNORMAL LOW (ref 0.167–5.380)

## 2019-09-09 LAB — TSH: TSH: 1.16 u[IU]/mL (ref 0.450–4.500)

## 2019-09-11 ENCOUNTER — Other Ambulatory Visit: Payer: BC Managed Care – PPO | Admitting: *Deleted

## 2019-09-11 ENCOUNTER — Other Ambulatory Visit: Payer: Self-pay

## 2019-09-11 DIAGNOSIS — Z79899 Other long term (current) drug therapy: Secondary | ICD-10-CM | POA: Diagnosis not present

## 2019-09-11 DIAGNOSIS — R002 Palpitations: Secondary | ICD-10-CM | POA: Diagnosis not present

## 2019-09-11 DIAGNOSIS — I10 Essential (primary) hypertension: Secondary | ICD-10-CM | POA: Diagnosis not present

## 2019-09-11 LAB — BASIC METABOLIC PANEL
BUN/Creatinine Ratio: 21 (ref 9–23)
BUN: 14 mg/dL (ref 6–24)
CO2: 26 mmol/L (ref 20–29)
Calcium: 9.8 mg/dL (ref 8.7–10.2)
Chloride: 102 mmol/L (ref 96–106)
Creatinine, Ser: 0.68 mg/dL (ref 0.57–1.00)
GFR calc Af Amer: 116 mL/min/{1.73_m2} (ref >59)
GFR calc non Af Amer: 101 mL/min/{1.73_m2} (ref >59)
Glucose: 81 mg/dL (ref 65–99)
Potassium: 5 mmol/L (ref 3.5–5.2)
Sodium: 139 mmol/L (ref 134–144)

## 2019-09-12 ENCOUNTER — Other Ambulatory Visit: Payer: Self-pay | Admitting: *Deleted

## 2019-09-12 ENCOUNTER — Telehealth: Payer: Self-pay

## 2019-09-12 DIAGNOSIS — M25472 Effusion, left ankle: Secondary | ICD-10-CM

## 2019-09-12 MED ORDER — POTASSIUM CHLORIDE CRYS ER 20 MEQ PO TBCR: 20.0000 meq | EXTENDED_RELEASE_TABLET | ORAL | 0 refills | Status: DC | PRN

## 2019-09-12 NOTE — Telephone Encounter (Signed)
Changed directions to only as needed ./cy

## 2019-09-12 NOTE — Telephone Encounter (Signed)
-----   Message from Sueanne Margarita, MD sent at 09/11/2019  5:31 PM EDT ----- Potassium upper normal - please find out if patient is taking potassium

## 2019-09-12 NOTE — Telephone Encounter (Signed)
Pt is aware of lab results; she does not take a regular K+ supplement. She did take one about 6 days ago when she took a diuretic for a swollen foot, but has not recently taken another. She does not take diuretics regularly. She understands her K+ is in the higher normal range and will not take any additional K+ supplement at this time.

## 2019-09-12 NOTE — Telephone Encounter (Signed)
Potassium still on MAR so please remove

## 2019-09-13 ENCOUNTER — Other Ambulatory Visit: Payer: Self-pay | Admitting: Cardiology

## 2019-09-13 DIAGNOSIS — M25472 Effusion, left ankle: Secondary | ICD-10-CM

## 2019-09-15 ENCOUNTER — Other Ambulatory Visit: Payer: Self-pay | Admitting: Cardiology

## 2019-09-15 DIAGNOSIS — M25472 Effusion, left ankle: Secondary | ICD-10-CM

## 2019-09-16 ENCOUNTER — Other Ambulatory Visit: Payer: Self-pay

## 2019-09-16 ENCOUNTER — Encounter (HOSPITAL_COMMUNITY): Payer: BC Managed Care – PPO

## 2019-09-16 DIAGNOSIS — R7989 Other specified abnormal findings of blood chemistry: Secondary | ICD-10-CM

## 2019-09-17 ENCOUNTER — Ambulatory Visit (HOSPITAL_COMMUNITY)
Admission: RE | Admit: 2019-09-17 | Discharge: 2019-09-17 | Disposition: A | Discharge status: Home / Self Care | Payer: BC Managed Care – PPO | Source: Ambulatory Visit | Attending: Cardiovascular Disease | Admitting: Cardiovascular Disease

## 2019-09-17 ENCOUNTER — Encounter: Payer: Self-pay | Admitting: Gynecology

## 2019-09-17 ENCOUNTER — Other Ambulatory Visit: Payer: Self-pay

## 2019-09-17 DIAGNOSIS — I10 Essential (primary) hypertension: Secondary | ICD-10-CM | POA: Diagnosis not present

## 2019-09-18 ENCOUNTER — Telehealth: Payer: Self-pay

## 2019-09-18 ENCOUNTER — Other Ambulatory Visit: Payer: Self-pay

## 2019-09-18 DIAGNOSIS — R7989 Other specified abnormal findings of blood chemistry: Secondary | ICD-10-CM

## 2019-09-18 NOTE — Progress Notes (Signed)
Per Dr. Radford Pax,  Renin Suppression and Aldo Renin Level is very abnormal, would like patient to have further workup with Endocrinology.Refer to Dr. Hassan Buckler for further evatluation.

## 2019-09-18 NOTE — Telephone Encounter (Signed)
-----   Message from Sueanne Margarita, MD sent at 09/17/2019  2:37 PM EDT ----- No renal artery stenosis

## 2019-09-18 NOTE — Telephone Encounter (Signed)
Notes recorded by Frederik Schmidt, RN on 09/18/2019 at 8:33 AM EDT  Lpm with results 9/24  ------

## 2019-09-22 MED ORDER — AMLODIPINE BESYLATE 5 MG PO TABS: 5.0000 mg | ORAL_TABLET | Freq: Every day | ORAL | 2 refills | Status: DC

## 2019-10-20 DIAGNOSIS — I10 Essential (primary) hypertension: Secondary | ICD-10-CM | POA: Diagnosis not present

## 2019-10-20 DIAGNOSIS — Z9089 Acquired absence of other organs: Secondary | ICD-10-CM | POA: Diagnosis not present

## 2019-10-20 DIAGNOSIS — Z85850 Personal history of malignant neoplasm of thyroid: Secondary | ICD-10-CM | POA: Diagnosis not present

## 2019-10-20 DIAGNOSIS — C73 Malignant neoplasm of thyroid gland: Secondary | ICD-10-CM | POA: Diagnosis not present

## 2019-10-20 DIAGNOSIS — Z08 Encounter for follow-up examination after completed treatment for malignant neoplasm: Secondary | ICD-10-CM | POA: Diagnosis not present

## 2019-10-21 DIAGNOSIS — I10 Essential (primary) hypertension: Secondary | ICD-10-CM | POA: Diagnosis not present

## 2019-10-21 DIAGNOSIS — E890 Postprocedural hypothyroidism: Secondary | ICD-10-CM | POA: Diagnosis not present

## 2019-10-21 DIAGNOSIS — C73 Malignant neoplasm of thyroid gland: Secondary | ICD-10-CM | POA: Diagnosis not present

## 2019-11-06 ENCOUNTER — Encounter: Payer: Self-pay | Admitting: Family Medicine

## 2019-11-06 ENCOUNTER — Telehealth: Payer: Self-pay | Admitting: Family Medicine

## 2019-11-06 NOTE — Telephone Encounter (Signed)
Medication Refill - Medication: lisdexamfetamine (VYVANSE) 40 MG capsule   Has the patient contacted their pharmacy? Yes.   (Agent: If no, request that the patient contact the pharmacy for the refill.) (Agent: If yes, when and what did the pharmacy advise?)  Preferred Pharmacy (with phone number or street name):  [facility] [phone] - [address], [place]  [place] [address]  Phone: [phone] Fax: [phone]     Agent: Please be advised that RX refills may take up to 3 business days. We ask that you follow-up with your pharmacy.

## 2019-11-09 MED ORDER — LISDEXAMFETAMINE DIMESYLATE 40 MG PO CAPS: ORAL_CAPSULE | ORAL | 0 refills | Status: DC

## 2019-11-17 DIAGNOSIS — I10 Essential (primary) hypertension: Secondary | ICD-10-CM | POA: Diagnosis not present

## 2019-11-18 DIAGNOSIS — R002 Palpitations: Secondary | ICD-10-CM | POA: Diagnosis not present

## 2019-11-18 DIAGNOSIS — R000 Tachycardia, unspecified: Secondary | ICD-10-CM | POA: Diagnosis not present

## 2019-11-18 DIAGNOSIS — I10 Essential (primary) hypertension: Secondary | ICD-10-CM | POA: Diagnosis not present

## 2019-11-18 DIAGNOSIS — R51 Headache with orthostatic component, not elsewhere classified: Secondary | ICD-10-CM | POA: Diagnosis not present

## 2019-12-16 DIAGNOSIS — R030 Elevated blood-pressure reading, without diagnosis of hypertension: Secondary | ICD-10-CM | POA: Diagnosis not present

## 2020-01-08 DIAGNOSIS — C73 Malignant neoplasm of thyroid gland: Secondary | ICD-10-CM | POA: Diagnosis not present

## 2020-01-08 DIAGNOSIS — I10 Essential (primary) hypertension: Secondary | ICD-10-CM | POA: Diagnosis not present

## 2020-01-08 DIAGNOSIS — R000 Tachycardia, unspecified: Secondary | ICD-10-CM | POA: Diagnosis not present

## 2020-01-08 DIAGNOSIS — F988 Other specified behavioral and emotional disorders with onset usually occurring in childhood and adolescence: Secondary | ICD-10-CM | POA: Diagnosis not present

## 2020-01-08 DIAGNOSIS — Z79899 Other long term (current) drug therapy: Secondary | ICD-10-CM | POA: Diagnosis not present

## 2020-01-08 DIAGNOSIS — R030 Elevated blood-pressure reading, without diagnosis of hypertension: Secondary | ICD-10-CM | POA: Diagnosis not present

## 2020-01-08 DIAGNOSIS — R0989 Other specified symptoms and signs involving the circulatory and respiratory systems: Secondary | ICD-10-CM | POA: Diagnosis not present

## 2020-01-08 DIAGNOSIS — E269 Hyperaldosteronism, unspecified: Secondary | ICD-10-CM | POA: Diagnosis not present

## 2020-01-08 DIAGNOSIS — R002 Palpitations: Secondary | ICD-10-CM | POA: Diagnosis not present

## 2020-01-16 ENCOUNTER — Other Ambulatory Visit: Payer: Self-pay | Admitting: Women's Health

## 2020-01-16 DIAGNOSIS — Z1231 Encounter for screening mammogram for malignant neoplasm of breast: Secondary | ICD-10-CM

## 2020-01-21 ENCOUNTER — Ambulatory Visit
Admission: RE | Admit: 2020-01-21 | Discharge: 2020-01-21 | Disposition: A | Discharge status: Home / Self Care | Payer: BC Managed Care – PPO | Source: Ambulatory Visit

## 2020-01-21 ENCOUNTER — Other Ambulatory Visit: Payer: Self-pay

## 2020-01-21 DIAGNOSIS — Z1231 Encounter for screening mammogram for malignant neoplasm of breast: Secondary | ICD-10-CM | POA: Diagnosis not present

## 2020-02-05 ENCOUNTER — Telehealth: Payer: Self-pay | Admitting: Family Medicine

## 2020-02-05 NOTE — Telephone Encounter (Signed)
Medication Refill: Vyvanse 40MG  Pharmacy: [facility] [phone]   Patient request a phone call when the prescription has been sent in.

## 2020-02-06 MED ORDER — LISDEXAMFETAMINE DIMESYLATE 40 MG PO CAPS: ORAL_CAPSULE | ORAL | 0 refills | Status: DC

## 2020-02-06 NOTE — Telephone Encounter (Signed)
Vyvanse refilled

## 2020-02-06 NOTE — Telephone Encounter (Signed)
Called patient and LMOVM to return call  Left a detailed voice message that her medication request has been filled.

## 2020-02-09 ENCOUNTER — Other Ambulatory Visit: Payer: Self-pay

## 2020-02-10 ENCOUNTER — Encounter: Payer: Self-pay | Admitting: Women's Health

## 2020-02-10 ENCOUNTER — Ambulatory Visit (INDEPENDENT_AMBULATORY_CARE_PROVIDER_SITE_OTHER): Payer: BC Managed Care – PPO | Admitting: Women's Health

## 2020-02-10 VITALS — BP 132/80 | Ht 70.0 in | Wt 193.0 lb

## 2020-02-10 DIAGNOSIS — Z113 Encounter for screening for infections with a predominantly sexual mode of transmission: Secondary | ICD-10-CM | POA: Diagnosis not present

## 2020-02-10 DIAGNOSIS — N912 Amenorrhea, unspecified: Secondary | ICD-10-CM | POA: Diagnosis not present

## 2020-02-10 DIAGNOSIS — Z01419 Encounter for gynecological examination (general) (routine) without abnormal findings: Secondary | ICD-10-CM | POA: Diagnosis not present

## 2020-02-10 DIAGNOSIS — N911 Secondary amenorrhea: Secondary | ICD-10-CM

## 2020-02-10 DIAGNOSIS — B009 Herpesviral infection, unspecified: Secondary | ICD-10-CM

## 2020-02-10 DIAGNOSIS — Z13220 Encounter for screening for lipoid disorders: Secondary | ICD-10-CM

## 2020-02-10 MED ORDER — VALACYCLOVIR HCL 500 MG PO TABS: 500.0000 mg | ORAL_TABLET | Freq: Two times a day (BID) | ORAL | 1 refills | Status: DC | PRN

## 2020-02-10 NOTE — Patient Instructions (Addendum)
mirena IUD  Due out in august Vit D 2000 iu daily [facility] colonoscopy  [phone] Health Maintenance, Female Adopting a healthy lifestyle and getting preventive care are important in promoting health and wellness. Ask your health care provider about:  The right schedule for you to have regular tests and exams.  Things you can do on your own to prevent diseases and keep yourself healthy. What should I know about diet, weight, and exercise? Eat a healthy diet   Eat a diet that includes plenty of vegetables, fruits, low-fat dairy products, and lean protein.  Do not eat a lot of foods that are high in solid fats, added sugars, or sodium. Maintain a healthy weight Body mass index (BMI) is used to identify weight problems. It estimates body fat based on height and weight. Your health care provider can help determine your BMI and help you achieve or maintain a healthy weight. Get regular exercise Get regular exercise. This is one of the most important things you can do for your health. Most adults should:  Exercise for at least 150 minutes each week. The exercise should increase your heart rate and make you sweat (moderate-intensity exercise).  Do strengthening exercises at least twice a week. This is in addition to the moderate-intensity exercise.  Spend less time sitting. Even light physical activity can be beneficial. Watch cholesterol and blood lipids Have your blood tested for lipids and cholesterol at 53 years of age, then have this test every 5 years. Have your cholesterol levels checked more often if:  Your lipid or cholesterol levels are high.  You are older than 53 years of age.  You are at high risk for heart disease. What should I know about cancer screening? Depending on your health history and family history, you may need to have cancer screening at various ages. This may include screening for:  Breast cancer.  Cervical cancer.  Colorectal cancer.  Skin  cancer.  Lung cancer. What should I know about heart disease, diabetes, and high blood pressure? Blood pressure and heart disease  High blood pressure causes heart disease and increases the risk of stroke. This is more likely to develop in people who have high blood pressure readings, are of African descent, or are overweight.  Have your blood pressure checked: ? Every 3-5 years if you are 76-31 years of age. ? Every year if you are 52 years old or older. Diabetes Have regular diabetes screenings. This checks your fasting blood sugar level. Have the screening done:  Once every three years after age 49 if you are at a normal weight and have a low risk for diabetes.  More often and at a younger age if you are overweight or have a high risk for diabetes. What should I know about preventing infection? Hepatitis B If you have a higher risk for hepatitis B, you should be screened for this virus. Talk with your health care provider to find out if you are at risk for hepatitis B infection. Hepatitis C Testing is recommended for:  Everyone born from [birth year] through 1964.  Anyone with known risk factors for hepatitis C. Sexually transmitted infections (STIs)  Get screened for STIs, including gonorrhea and chlamydia, if: ? You are sexually active and are younger than 53 years of age. ? You are older than 53 years of age and your health care provider tells you that you are at risk for this type of infection. ? Your sexual activity has changed since you were last  screened, and you are at increased risk for chlamydia or gonorrhea. Ask your health care provider if you are at risk.  Ask your health care provider about whether you are at high risk for HIV. Your health care provider may recommend a prescription medicine to help prevent HIV infection. If you choose to take medicine to prevent HIV, you should first get tested for HIV. You should then be tested every 3 months for as long as you are taking  the medicine. Pregnancy  If you are about to stop having your period (premenopausal) and you may become pregnant, seek counseling before you get pregnant.  Take 400 to 800 micrograms (mcg) of folic acid every day if you become pregnant.  Ask for birth control (contraception) if you want to prevent pregnancy. Osteoporosis and menopause Osteoporosis is a disease in which the bones lose minerals and strength with aging. This can result in bone fractures. If you are 82 years old or older, or if you are at risk for osteoporosis and fractures, ask your health care provider if you should:  Be screened for bone loss.  Take a calcium or vitamin D supplement to lower your risk of fractures.  Be given hormone replacement therapy (HRT) to treat symptoms of menopause. Follow these instructions at home: Lifestyle  Do not use any products that contain nicotine or tobacco, such as cigarettes, e-cigarettes, and chewing tobacco. If you need help quitting, ask your health care provider.  Do not use street drugs.  Do not share needles.  Ask your health care provider for help if you need support or information about quitting drugs. Alcohol use  Do not drink alcohol if: ? Your health care provider tells you not to drink. ? You are pregnant, may be pregnant, or are planning to become pregnant.  If you drink alcohol: ? Limit how much you use to 0-1 drink a day. ? Limit intake if you are breastfeeding.  Be aware of how much alcohol is in your drink. In the [place] one drink equals one 12 oz bottle of beer (355 mL), one 5 oz glass of wine (148 mL), or one 1 oz glass of hard liquor (44 mL). General instructions  Schedule regular health, dental, and eye exams.  Stay current with your vaccines.  Tell your health care provider if: ? You often feel depressed. ? You have ever been abused or do not feel safe at home. Summary  Adopting a healthy lifestyle and getting preventive care are important in  promoting health and wellness.  Follow your health care provider's instructions about healthy diet, exercising, and getting tested or screened for diseases.  Follow your health care provider's instructions on monitoring your cholesterol and blood pressure. This information is not intended to replace advice given to you by your health care provider. Make sure you discuss any questions you have with your health care provider. Document Revised: 12/04/2018 Document Reviewed: 12/04/2018 Elsevier Patient Education  2020 [employer].

## 2020-02-10 NOTE — Progress Notes (Signed)
Jennifer Shepard [birth year] [id number]    History:    Presents for annual exam.  07/2015 Mirena IUD with rare spotting.  New partner.  Has not had a screening colonoscopy.  Normal Pap and mammogram history.  Hypertension, hypothyroidism-primary care manages.  HSV rare outbreaks.    Past medical history, past surgical history, family history and social history were all reviewed and documented in the EPIC chart.  Desk job IT consultant.  Children are 14 and 15 both doing well and has had Gardasil.  07/2018 thyroidectomy thyroid cancer.  ROS:  A ROS was performed and pertinent positives and negatives are included.  Exam:  Vitals:   02/10/20 0933  BP: 132/80  Weight: 193 lb (87.5 kg)  Height: 5\' 10"  (1.778 m)   Body mass index is 27.69 kg/m.   General appearance:  Normal Thyroid:  Symmetrical, normal in size, without palpable masses or nodularity. Respiratory  Auscultation:  Clear without wheezing or rhonchi Cardiovascular  Auscultation:  Regular rate, without rubs, murmurs or gallops  Edema/varicosities:  Not grossly evident Abdominal  Soft,nontender, without masses, guarding or rebound.  Liver/spleen:  No organomegaly noted  Hernia:  None appreciated  Skin  Inspection:  Grossly normal   Breasts: Examined lying and sitting.     Right: Without masses, retractions, discharge or axillary adenopathy.     Left: Without masses, retractions, discharge or axillary adenopathy. Gentitourinary   Inguinal/mons:  Normal without inguinal adenopathy  External genitalia:  Normal  BUS/Urethra/Skene's glands:  Normal  Vagina:  Normal  Cervix:  Normal IUD strings visible  Uterus:  normal in size, shape and contour.  Midline and mobile  Adnexa/parametria:     Rt: Without masses or tenderness.   Lt: Without masses or tenderness.  Anus and perineum: Normal  Digital rectal exam: Normal sphincter tone without palpated masses or tenderness  Assessment/Plan:  54 y.o. D WF G4, P2 for annual  exam with no complaints of vaginal discharge, urinary symptoms, GI problems or abdominal pain.  07/2015 Mirena IUD rare spotting Hypertension, anxiety/depression, hypothyroidism-primary care manages labs and meds STD screen HSV 1 rare outbreaks  Plan: We will check Jennifer Shepard if elevated return to office in August for IUD to be removed if low will have removed and replaced.  SBEs, continue annual screening mammogram, calcium rich foods, low-sodium diet, vitamin D 2000 IUs daily encouraged.  Aware of importance of exercise as making an exercise room at home and plans to get into a regular routine..  Screening colonoscopy reviewed and encouraged with our GI information given instructed to schedule.  Refill of Valtrex 500 mg twice daily for 3 to 5 days as needed prescription, proper use given and reviewed.  Fasting and requested labs to be done she will take to primary care.  CBC, CMP, lipid panel, GC/chlamydia, HIV, RPR, Pap normal 11/2018, new screening guidelines reviewed.Jennifer Shepard [facility], 1:22 PM 02/10/2020

## 2020-02-11 LAB — CBC WITH DIFFERENTIAL/PLATELET
Absolute Monocytes: 472 cells/uL (ref 200–950)
Basophils Absolute: 58 cells/uL (ref 0–200)
Basophils Relative: 1.1 %
Eosinophils Absolute: 42 cells/uL (ref 15–500)
Eosinophils Relative: 0.8 %
HCT: 40.1 % (ref 35.0–45.0)
Hemoglobin: 13.5 g/dL (ref 11.7–15.5)
Lymphs Abs: 1887 cells/uL (ref 850–3900)
MCH: 32.5 pg (ref 27.0–33.0)
MCHC: 33.7 g/dL (ref 32.0–36.0)
MCV: 96.6 fL (ref 80.0–100.0)
MPV: 10.3 fL (ref 7.5–12.5)
Monocytes Relative: 8.9 %
Neutro Abs: 2841 cells/uL (ref 1500–7800)
Neutrophils Relative %: 53.6 %
Platelets: 279 10*3/uL (ref 140–400)
RBC: 4.15 10*6/uL (ref 3.80–5.10)
RDW: 12 % (ref 11.0–15.0)
Total Lymphocyte: 35.6 %
WBC: 5.3 10*3/uL (ref 3.8–10.8)

## 2020-02-11 LAB — LIPID PANEL
Cholesterol: 221 mg/dL — ABNORMAL HIGH (ref <200)
HDL: 97 mg/dL (ref >=50)
LDL Cholesterol (Calc): 110 mg/dL (calc) — ABNORMAL HIGH
Non-HDL Cholesterol (Calc): 124 mg/dL (calc) (ref <130)
Total CHOL/HDL Ratio: 2.3 (calc) (ref <5.0)
Triglycerides: 58 mg/dL (ref <150)

## 2020-02-11 LAB — COMPREHENSIVE METABOLIC PANEL
AG Ratio: 1.7 (calc) (ref 1.0–2.5)
ALT: 16 U/L (ref 6–29)
AST: 18 U/L (ref 10–35)
Albumin: 4.5 g/dL (ref 3.6–5.1)
Alkaline phosphatase (APISO): 56 U/L (ref 37–153)
BUN: 12 mg/dL (ref 7–25)
CO2: 30 mmol/L (ref 20–32)
Calcium: 9.6 mg/dL (ref 8.6–10.4)
Chloride: 101 mmol/L (ref 98–110)
Creat: 0.63 mg/dL (ref 0.50–1.05)
Globulin: 2.6 g/dL (calc) (ref 1.9–3.7)
Glucose, Bld: 75 mg/dL (ref 65–99)
Potassium: 4.7 mmol/L (ref 3.5–5.3)
Sodium: 138 mmol/L (ref 135–146)
Total Bilirubin: 0.6 mg/dL (ref 0.2–1.2)
Total Protein: 7.1 g/dL (ref 6.1–8.1)

## 2020-02-11 LAB — GC PROBE AMP THINPREP: N. gonorrhoeae RNA, TMA: NOT DETECTED

## 2020-02-11 LAB — RPR: RPR Ser Ql: NONREACTIVE

## 2020-02-11 LAB — CHLAMYDIA PROBE AMP THINPREP: C. trachomatis RNA, TMA: NOT DETECTED

## 2020-02-11 LAB — FOLLICLE STIMULATING HORMONE: FSH: 63.7 m[IU]/mL

## 2020-02-11 LAB — HIV ANTIBODY (ROUTINE TESTING W REFLEX): HIV 1&2 Ab, 4th Generation: NONREACTIVE

## 2020-02-16 DIAGNOSIS — E269 Hyperaldosteronism, unspecified: Secondary | ICD-10-CM | POA: Diagnosis not present

## 2020-02-16 DIAGNOSIS — N200 Calculus of kidney: Secondary | ICD-10-CM | POA: Diagnosis not present

## 2020-02-16 DIAGNOSIS — R030 Elevated blood-pressure reading, without diagnosis of hypertension: Secondary | ICD-10-CM | POA: Diagnosis not present

## 2020-02-24 DIAGNOSIS — L820 Inflamed seborrheic keratosis: Secondary | ICD-10-CM | POA: Diagnosis not present

## 2020-02-24 DIAGNOSIS — L700 Acne vulgaris: Secondary | ICD-10-CM | POA: Diagnosis not present

## 2020-02-24 DIAGNOSIS — L920 Granuloma annulare: Secondary | ICD-10-CM | POA: Diagnosis not present

## 2020-02-24 DIAGNOSIS — D1801 Hemangioma of skin and subcutaneous tissue: Secondary | ICD-10-CM | POA: Diagnosis not present

## 2020-02-24 DIAGNOSIS — D485 Neoplasm of uncertain behavior of skin: Secondary | ICD-10-CM | POA: Diagnosis not present

## 2020-02-24 DIAGNOSIS — D225 Melanocytic nevi of trunk: Secondary | ICD-10-CM | POA: Diagnosis not present

## 2020-02-24 DIAGNOSIS — I10 Essential (primary) hypertension: Secondary | ICD-10-CM | POA: Diagnosis not present

## 2020-05-03 ENCOUNTER — Other Ambulatory Visit: Payer: Self-pay | Admitting: Family Medicine

## 2020-05-03 MED ORDER — LISDEXAMFETAMINE DIMESYLATE 40 MG PO CAPS: ORAL_CAPSULE | ORAL | 0 refills | Status: DC

## 2020-05-03 NOTE — Telephone Encounter (Signed)
Last filled:02/06/20 Last ov:08/29/2019

## 2020-05-03 NOTE — Telephone Encounter (Signed)
Pt is requesting a refill on Vyvanse 40 mg for a 90 day supply. Pt uses [facility] on [address]. Pt is requesting a call once this is done. Thanks

## 2020-05-03 NOTE — Telephone Encounter (Signed)
Will refill once but will need office follow up before further refills.

## 2020-07-12 ENCOUNTER — Other Ambulatory Visit: Payer: Self-pay | Admitting: Family Medicine

## 2020-07-13 ENCOUNTER — Telehealth: Payer: Self-pay

## 2020-07-13 NOTE — Telephone Encounter (Signed)
Please advise 

## 2020-07-13 NOTE — Telephone Encounter (Signed)
lvm for pt to call back pt needs office visit scheduled for further refills on metoprolol

## 2020-07-13 NOTE — Telephone Encounter (Signed)
Needs follow  up.  Refill once and set up follow up.

## 2020-07-14 DIAGNOSIS — Z20822 Contact with and (suspected) exposure to covid-19: Secondary | ICD-10-CM | POA: Diagnosis not present

## 2020-07-29 ENCOUNTER — Other Ambulatory Visit: Payer: Self-pay | Admitting: Cardiology

## 2020-08-02 ENCOUNTER — Telehealth: Payer: Self-pay | Admitting: Family Medicine

## 2020-08-02 ENCOUNTER — Other Ambulatory Visit: Payer: Self-pay | Admitting: Cardiology

## 2020-08-02 NOTE — Telephone Encounter (Signed)
LVM to set up appt

## 2020-08-02 NOTE — Telephone Encounter (Signed)
Pt called in for med refill and asked for a call back once its sent in at [phone]   Medication Refill:  Vyvanse 90-day supply  Pharmacy: [facility] [place], [place] [address] Phone:  [phone]  Fax:  [phone]

## 2020-08-02 NOTE — Telephone Encounter (Signed)
Please set pt up with an appt virtual or in office has not seen burchette since April of 2020

## 2020-08-03 ENCOUNTER — Telehealth (INDEPENDENT_AMBULATORY_CARE_PROVIDER_SITE_OTHER): Payer: BC Managed Care – PPO | Admitting: Family Medicine

## 2020-08-03 DIAGNOSIS — F900 Attention-deficit hyperactivity disorder, predominantly inattentive type: Secondary | ICD-10-CM

## 2020-08-03 MED ORDER — LISDEXAMFETAMINE DIMESYLATE 40 MG PO CAPS: ORAL_CAPSULE | ORAL | 0 refills | Status: DC

## 2020-08-03 NOTE — Progress Notes (Signed)
Patient ID: Jennifer Shepard, female   DOB: 1966/07/25, 54 y.o.   MRN: [id number]  This visit type was conducted due to national recommendations for restrictions regarding the COVID-19 pandemic in an effort to limit this patient's exposure and mitigate transmission in our community.   Virtual Visit via Video Note  I connected with Jennifer Shepard on 08/03/20 at 10:15 AM EDT by a video enabled telemedicine application and verified that I am speaking with the correct person using two identifiers.  Location patient: home Location provider:work or home office Persons participating in the virtual visit: patient, provider  I discussed the limitations of evaluation and management by telemedicine and the availability of in person appointments. The patient expressed understanding and agreed to proceed.   HPI: Jennifer Shepard had called needing refill of Vyvanse.  She has ADD.  She does have hypertension which has been somewhat challenging to manage.  She had seen cardiology and then subsequently went to [facility].  She has had work-up for secondary causes of hypertension which were not found on further evaluation of her adrenal glands.  She had abnormal aldosterone renin ratio.  She is currently treated for hypertension with combination therapy of metoprolol, losartan, and verapamil.  She had 24-hour ambulatory blood pressure monitor apparently had a fairly good average of 130/75 with that though her home readings still remain slightly elevated  She remains on Vyvanse 40 mg daily for ADD and that seems to be working well.  No major insomnia issues.  No side effects.   ROS: See pertinent positives and negatives per HPI.  Past Medical History:  Diagnosis Date  . Benign essential HTN   . Cancer [facility])    Thyroid    Past Surgical History:  Procedure Laterality Date  . APPENDECTOMY    . BREAST CYST ASPIRATION Right pt unsure   no visible scar  . CESAREAN SECTION  01/2004  . CESAREAN SECTION  10/2005  .  INTRAUTERINE DEVICE INSERTION  06/26/2015   Mirena  . LASIK    . THYROIDECTOMY  07/2009   FOR THYROID CANCER; Dr Vicie Mutters    Family History  Problem Relation Age of Onset  . Hypertension Mother   . Hypertension Maternal Grandmother   . CAD Maternal Grandmother        CBAG in 8s; pacer  . Hyperlipidemia Father   . Cancer Neg Hx   . Diabetes Neg Hx   . Stroke Neg Hx   . Breast cancer Neg Hx     SOCIAL HX: Non-smoker   Current Outpatient Medications:  .  verapamil (VERELAN PM) 240 MG 24 hr capsule, Take 240 mg by mouth at bedtime., Disp: , Rfl:  .  ARMOUR THYROID 60 MG tablet, Take 120 mg by mouth daily. , Disp: , Rfl: 0 .  cevimeline (EVOXAC) 30 MG capsule, Take 30 mg by mouth 3 (three) times daily., Disp: , Rfl:  .  Cholecalciferol (VITAMIN D PO), Take 5,000 Units by mouth daily. , Disp: , Rfl:  .  lisdexamfetamine (VYVANSE) 40 MG capsule, Take one daily., Disp: 90 capsule, Rfl: 0 .  losartan (COZAAR) 50 MG tablet, Take 1 tablet (50 mg total) by mouth daily. Please make overdue appt with Dr. Radford Pax before anymore refills. 1st attempt, Disp: 30 tablet, Rfl: 0 .  metoprolol succinate (TOPROL-XL) 50 MG 24 hr tablet, Take 1 tablet (50 mg total) by mouth daily. (Patient taking differently: Take 50 mg by mouth in the morning and at bedtime. ), Disp: 30 tablet,  Rfl: 0 .  Probiotic Product (PROBIOTIC DAILY PO), Take 1 tablet by mouth daily., Disp: , Rfl:  .  valACYclovir (VALTREX) 500 MG tablet, Take 1 tablet (500 mg total) by mouth 2 (two) times daily as needed (Take 3-5 days for fever blisters)., Disp: 30 tablet, Rfl: 1  Current Facility-Administered Medications:  .  levonorgestrel (MIRENA) 20 MCG/24HR IUD, , Intrauterine, Once, Fontaine, Belinda Block, MD  EXAM:  VITALS per patient if applicable:  GENERAL: alert, oriented, appears well and in no acute distress  HEENT: atraumatic, conjunttiva clear, no obvious abnormalities on inspection of external nose and ears  NECK: normal  movements of the head and neck  LUNGS: on inspection no signs of respiratory distress, breathing rate appears normal, no obvious gross SOB, gasping or wheezing  CV: no obvious cyanosis  MS: moves all visible extremities without noticeable abnormality  PSYCH/NEURO: pleasant and cooperative, no obvious depression or anxiety, speech and thought processing grossly intact  ASSESSMENT AND PLAN:  Discussed the following assessment and plan:  Adult attention deficit disorder-stable  -Continue Vyvanse 40 mg daily with prescription written for 3 months -Continue close monitoring of blood pressure     I discussed the assessment and treatment plan with the patient. The patient was provided an opportunity to ask questions and all were answered. The patient agreed with the plan and demonstrated an understanding of the instructions.   The patient was advised to call back or seek an in-person evaluation if the symptoms worsen or if the condition fails to improve as anticipated.     Carolann Littler, MD

## 2020-08-20 ENCOUNTER — Other Ambulatory Visit: Payer: Self-pay | Admitting: Cardiology

## 2020-08-23 ENCOUNTER — Other Ambulatory Visit: Payer: Self-pay | Admitting: Cardiology

## 2020-08-24 DIAGNOSIS — H524 Presbyopia: Secondary | ICD-10-CM | POA: Diagnosis not present

## 2020-08-24 DIAGNOSIS — H04123 Dry eye syndrome of bilateral lacrimal glands: Secondary | ICD-10-CM | POA: Diagnosis not present

## 2020-09-28 DIAGNOSIS — E890 Postprocedural hypothyroidism: Secondary | ICD-10-CM | POA: Diagnosis not present

## 2020-09-28 DIAGNOSIS — F988 Other specified behavioral and emotional disorders with onset usually occurring in childhood and adolescence: Secondary | ICD-10-CM | POA: Diagnosis not present

## 2020-09-28 DIAGNOSIS — R030 Elevated blood-pressure reading, without diagnosis of hypertension: Secondary | ICD-10-CM | POA: Diagnosis not present

## 2020-10-15 DIAGNOSIS — C73 Malignant neoplasm of thyroid gland: Secondary | ICD-10-CM | POA: Diagnosis not present

## 2020-10-18 DIAGNOSIS — C73 Malignant neoplasm of thyroid gland: Secondary | ICD-10-CM | POA: Diagnosis not present

## 2020-10-19 ENCOUNTER — Other Ambulatory Visit: Payer: Self-pay | Admitting: Cardiology

## 2020-10-27 ENCOUNTER — Encounter: Payer: Self-pay | Admitting: Family Medicine

## 2020-10-28 ENCOUNTER — Encounter: Payer: Self-pay | Admitting: Nurse Practitioner

## 2020-10-28 ENCOUNTER — Other Ambulatory Visit: Payer: Self-pay

## 2020-10-28 ENCOUNTER — Ambulatory Visit: Payer: BC Managed Care – PPO | Admitting: Nurse Practitioner

## 2020-10-28 VITALS — BP 142/80

## 2020-10-28 DIAGNOSIS — R350 Frequency of micturition: Secondary | ICD-10-CM | POA: Diagnosis not present

## 2020-10-28 DIAGNOSIS — Z113 Encounter for screening for infections with a predominantly sexual mode of transmission: Secondary | ICD-10-CM | POA: Diagnosis not present

## 2020-10-28 DIAGNOSIS — N898 Other specified noninflammatory disorders of vagina: Secondary | ICD-10-CM | POA: Diagnosis not present

## 2020-10-28 DIAGNOSIS — B9689 Other specified bacterial agents as the cause of diseases classified elsewhere: Secondary | ICD-10-CM | POA: Diagnosis not present

## 2020-10-28 DIAGNOSIS — N760 Acute vaginitis: Secondary | ICD-10-CM | POA: Diagnosis not present

## 2020-10-28 LAB — URINALYSIS W MICROSCOPIC + REFLEX CULTURE
Bilirubin Urine: NEGATIVE
Glucose, UA: NEGATIVE
Hgb urine dipstick: NEGATIVE
Hyaline Cast: NONE SEEN /LPF
Ketones, ur: NEGATIVE
Leukocyte Esterase: NEGATIVE
Nitrites, Initial: NEGATIVE
Protein, ur: NEGATIVE
RBC / HPF: NONE SEEN /HPF (ref 0–2)
Specific Gravity, Urine: 1.02 (ref 1.001–1.03)
pH: 7 (ref 5.0–8.0)

## 2020-10-28 LAB — WET PREP FOR TRICH, YEAST, CLUE

## 2020-10-28 LAB — NO CULTURE INDICATED

## 2020-10-28 MED ORDER — LISDEXAMFETAMINE DIMESYLATE 40 MG PO CAPS: ORAL_CAPSULE | ORAL | 0 refills | Status: DC

## 2020-10-28 MED ORDER — METRONIDAZOLE 0.75 % VA GEL: 1.0000 | Freq: Two times a day (BID) | VAGINAL | 0 refills | Status: AC

## 2020-10-28 NOTE — Progress Notes (Signed)
   Acute Office Visit  Subjective:    Patient ID: Jennifer Shepard, female    DOB: 1966/10/28, 54 y.o.   MRN: [id number]   HPI 54 y.o. presents today for vaginal discomfort that started 5 days ago after intercourse. Denies vaginal discharge or itching, dysuria, urgency, or hematuria. New partner x 4 months.    Review of Systems  Constitutional: Negative.   Genitourinary: Positive for frequency and vaginal pain. Negative for dyspareunia, dysuria, hematuria, urgency, vaginal bleeding and vaginal discharge.       Objective:    Physical Exam Constitutional:      Appearance: Normal appearance.  Genitourinary:    General: Normal vulva.     Vagina: Normal.     Cervix: Normal.     Comments: IUD string visible    BP (!) 142/80 (BP Location: Right Arm, Patient Position: Sitting, Cuff Size: Normal)  Wt Readings from Last 3 Encounters:  02/10/20 193 lb (87.5 kg)  08/21/19 175 lb (79.4 kg)  04/25/19 180 lb (81.6 kg)   Wet prep + clue cells UA negative leukocytes, negative nitrites, moderate bacteria, few clue cells present     Assessment & Plan:   Problem List Items Addressed This Visit    None    Visit Diagnoses    Bacterial vaginosis    -  Primary   Relevant Medications   metroNIDAZOLE (METROGEL) 0.75 % vaginal gel   Screen for STD (sexually transmitted disease)       Relevant Orders   C. trachomatis/N. gonorrhoeae RNA   HIV Antibody (routine testing w rflx)   RPR   Vaginal irritation       Relevant Orders   WET PREP FOR TRICH, YEAST, CLUE   Urinary frequency       Relevant Orders   Urinalysis w microscopic + reflex cultur     Plan: Wet prep positive for clue cells.  MetroGel 0.75% twice a day for 4 days.  UA unremarkable.  GC/Chlamydia, HIV, RPR pending.  If symptoms worsen or do not improve she will return to office.  She is agreeable to plan.     Tamela Gammon [facility], 12:40 PM 10/28/2020

## 2020-10-28 NOTE — Patient Instructions (Signed)
Bacterial Vaginosis  Bacterial vaginosis is a vaginal infection that occurs when the normal balance of bacteria in the vagina is disrupted. It results from an overgrowth of certain bacteria. This is the most common vaginal infection among women ages 15-44. Because bacterial vaginosis increases your risk for STIs (sexually transmitted infections), getting treated can help reduce your risk for chlamydia, gonorrhea, herpes, and HIV (human immunodeficiency virus). Treatment is also important for preventing complications in pregnant women, because this condition can cause an early (premature) delivery. What are the causes? This condition is caused by an increase in harmful bacteria that are normally present in small amounts in the vagina. However, the reason that the condition develops is not fully understood. What increases the risk? The following factors may make you more likely to develop this condition:  Having a new sexual partner or multiple sexual partners.  Having unprotected sex.  Douching.  Having an intrauterine device (IUD).  Smoking.  Drug and alcohol abuse.  Taking certain antibiotic medicines.  Being pregnant. You cannot get bacterial vaginosis from toilet seats, bedding, swimming pools, or contact with objects around you. What are the signs or symptoms? Symptoms of this condition include:  Grey or white vaginal discharge. The discharge can also be watery or foamy.  A fish-like odor with discharge, especially after sexual intercourse or during menstruation.  Itching in and around the vagina.  Burning or pain with urination. Some women with bacterial vaginosis have no signs or symptoms. How is this diagnosed? This condition is diagnosed based on:  Your medical history.  A physical exam of the vagina.  Testing a sample of vaginal fluid under a microscope to look for a large amount of bad bacteria or abnormal cells. Your health care provider may use a cotton swab or  a small wooden spatula to collect the sample. How is this treated? This condition is treated with antibiotics. These may be given as a pill, a vaginal cream, or a medicine that is put into the vagina (suppository). If the condition comes back after treatment, a second round of antibiotics may be needed. Follow these instructions at home: Medicines  Take over-the-counter and prescription medicines only as told by your health care provider.  Take or use your antibiotic as told by your health care provider. Do not stop taking or using the antibiotic even if you start to feel better. General instructions  If you have a female sexual partner, tell her that you have a vaginal infection. She should see her health care provider and be treated if she has symptoms. If you have a female sexual partner, he does not need treatment.  During treatment: ? Avoid sexual activity until you finish treatment. ? Do not douche. ? Avoid alcohol as directed by your health care provider. ? Avoid breastfeeding as directed by your health care provider.  Drink enough water and fluids to keep your urine clear or pale yellow.  Keep the area around your vagina and rectum clean. ? Wash the area daily with warm water. ? Wipe yourself from front to back after using the toilet.  Keep all follow-up visits as told by your health care provider. This is important. How is this prevented?  Do not douche.  Wash the outside of your vagina with warm water only.  Use protection when having sex. This includes latex condoms and dental dams.  Limit how many sexual partners you have. To help prevent bacterial vaginosis, it is best to have sex with just one partner (  monogamous).  Make sure you and your sexual partner are tested for STIs.  Wear cotton or cotton-lined underwear.  Avoid wearing tight pants and pantyhose, especially during summer.  Limit the amount of alcohol that you drink.  Do not use any products that contain  nicotine or tobacco, such as cigarettes and e-cigarettes. If you need help quitting, ask your health care provider.  Do not use illegal drugs. Where to find more information  Centers for Disease Control and Prevention: www.cdc.gov/std  American Sexual Health Association (ASHA): www.ashastd.org  [place] Department of Health and Human Services, Office on Women's Health: www.womenshealth.gov/ or https://www.womenshealth.gov/a-z-topics/bacterial-vaginosis Contact a health care provider if:  Your symptoms do not improve, even after treatment.  You have more discharge or pain when urinating.  You have a fever.  You have pain in your abdomen.  You have pain during sex.  You have vaginal bleeding between periods. Summary  Bacterial vaginosis is a vaginal infection that occurs when the normal balance of bacteria in the vagina is disrupted.  Because bacterial vaginosis increases your risk for STIs (sexually transmitted infections), getting treated can help reduce your risk for chlamydia, gonorrhea, herpes, and HIV (human immunodeficiency virus). Treatment is also important for preventing complications in pregnant women, because the condition can cause an early (premature) delivery.  This condition is treated with antibiotic medicines. These may be given as a pill, a vaginal cream, or a medicine that is put into the vagina (suppository). This information is not intended to replace advice given to you by your health care provider. Make sure you discuss any questions you have with your health care provider. Document Revised: 11/23/2017 Document Reviewed: 08/26/2016 Elsevier Patient Education  2020 [employer]  

## 2020-10-29 LAB — HIV ANTIBODY (ROUTINE TESTING W REFLEX): HIV 1&2 Ab, 4th Generation: NONREACTIVE

## 2020-10-29 LAB — C. TRACHOMATIS/N. GONORRHOEAE RNA
C. trachomatis RNA, TMA: NOT DETECTED
N. gonorrhoeae RNA, TMA: NOT DETECTED

## 2020-10-29 LAB — RPR: RPR Ser Ql: NONREACTIVE

## 2020-11-02 DIAGNOSIS — Z8371 Family history of colonic polyps: Secondary | ICD-10-CM | POA: Diagnosis not present

## 2020-11-02 DIAGNOSIS — Z1211 Encounter for screening for malignant neoplasm of colon: Secondary | ICD-10-CM | POA: Diagnosis not present

## 2020-11-19 ENCOUNTER — Other Ambulatory Visit: Payer: Self-pay | Admitting: Cardiology

## 2020-11-23 ENCOUNTER — Telehealth: Payer: Self-pay

## 2020-11-23 NOTE — Telephone Encounter (Signed)
Left message for patient to call back in regards to losartan.

## 2020-12-03 ENCOUNTER — Telehealth: Payer: Self-pay

## 2020-12-03 ENCOUNTER — Encounter: Payer: Self-pay | Admitting: Obstetrics & Gynecology

## 2020-12-03 ENCOUNTER — Other Ambulatory Visit: Payer: Self-pay

## 2020-12-03 ENCOUNTER — Ambulatory Visit: Payer: BC Managed Care – PPO | Admitting: Obstetrics & Gynecology

## 2020-12-03 VITALS — BP 136/86

## 2020-12-03 DIAGNOSIS — B9689 Other specified bacterial agents as the cause of diseases classified elsewhere: Secondary | ICD-10-CM | POA: Diagnosis not present

## 2020-12-03 DIAGNOSIS — N898 Other specified noninflammatory disorders of vagina: Secondary | ICD-10-CM | POA: Diagnosis not present

## 2020-12-03 DIAGNOSIS — N760 Acute vaginitis: Secondary | ICD-10-CM | POA: Diagnosis not present

## 2020-12-03 LAB — WET PREP FOR TRICH, YEAST, CLUE

## 2020-12-03 MED ORDER — METRONIDAZOLE 0.75 % VA GEL: 1.0000 | Freq: Two times a day (BID) | VAGINAL | 1 refills | Status: AC

## 2020-12-03 NOTE — Telephone Encounter (Signed)
Was in 10/28/20 with TW and diagnosed with BV. She said she used the Rx Metrogel and symptoms went away for a few days but have returned. She asked if she could get another Rx for the Metrogel?

## 2020-12-03 NOTE — Telephone Encounter (Signed)
Patient came for office visit this afternoon.

## 2020-12-03 NOTE — Progress Notes (Signed)
    Jennifer Shepard [birth year] [id number]        54 y.o.  [id number]   RP: Vaginal itching and irritation  HPI:  Recurrence of vaginal itching and irritation, treated a BV early 10/2020.  No pelvic pain.  Recent full STI screen Negative, declines repeat.  Urine/BMs normal.   OB History  Gravida Para Term Preterm AB Living  4 2     2 2   SAB IAB Ectopic Multiple Live Births               # Outcome Date GA Lbr Len/2nd Weight Sex Delivery Anes PTL Lv  4 AB           3 AB           2 Para           1 Para             Past medical history,surgical history, problem list, medications, allergies, family history and social history were all reviewed and documented in the EPIC chart.   Directed ROS with pertinent positives and negatives documented in the history of present illness/assessment and plan.  Exam:  Vitals:   12/03/20 1426  BP: 136/86   General appearance:  Normal  Gynecologic exam: Vulva normal.  Speculum:  Cervix/vagina normal.  Increased discharge, wet prep done.  Wet Prep:  Clue cells present   Assessment/Plan:  54 y.o. [id number]   1. Vaginal itching BV confirmed by wet prep.  Counseling done.  Decision to treat with Metrogel 0.75% vaginally x 4 days, 1 refill sent. - WET PREP FOR TRICH, YEAST, CLUE  2. Bacterial vaginosis  Other orders - metroNIDAZOLE (METROGEL) 0.75 % vaginal gel; Place 1 Applicatorful vaginally 2 (two) times daily for 4 days.  Princess Bruins MD, 2:31 PM 12/03/2020

## 2020-12-06 ENCOUNTER — Encounter: Payer: Self-pay | Admitting: Obstetrics & Gynecology

## 2020-12-20 ENCOUNTER — Telehealth: Payer: Self-pay | Admitting: *Deleted

## 2020-12-20 NOTE — Telephone Encounter (Signed)
Jennifer Shepard (RN)--called stated--pt is positive for COVID and rule out for pneumonia. Pt recommended to go to ER due having symptoms of-discomfort, chest pain but pt declined. Nurse Jennifer Shepard) told pt next step is to call primary care as a protocol and to see pt can get an appointment. Please advise

## 2020-12-21 NOTE — Telephone Encounter (Signed)
Left message for patient to call the office and schedule virtual visit.

## 2020-12-21 NOTE — Telephone Encounter (Signed)
Set up virtual as soon as possible to discuss further and discuss options.

## 2020-12-22 ENCOUNTER — Telehealth: Payer: BC Managed Care – PPO | Admitting: Family Medicine

## 2020-12-22 ENCOUNTER — Encounter: Payer: Self-pay | Admitting: Oncology

## 2020-12-22 ENCOUNTER — Telehealth (HOSPITAL_COMMUNITY): Payer: Self-pay | Admitting: Oncology

## 2020-12-22 NOTE — Telephone Encounter (Signed)
Re: [facility]  Called to Discuss with patient about Covid symptoms and the use of regeneron, a monoclonal antibody infusion for those with mild to moderate Covid symptoms and at a high risk of hospitalization.     Pt is qualified for this infusion at the [facility] center due to co-morbid conditions and/or a member of an at-risk group.    Past Medical History:  Diagnosis Date  . Benign essential HTN   . Cancer [facility])    Thyroid    Specific risk condition- HTN BMI > 25 unvaccinated   Unable to reach pt. Left VM and MCM.  Mignon Pine, AGNP-C [phone] (Infusion Center Hotline)

## 2020-12-30 ENCOUNTER — Encounter: Payer: Self-pay | Admitting: Family Medicine

## 2021-01-19 ENCOUNTER — Encounter: Payer: Self-pay | Admitting: Family Medicine

## 2021-01-24 NOTE — Progress Notes (Signed)
(  Key: [id number])  Your information has been submitted to [place]. [employer] will review the request and notify you of the determination decision directly, typically within 72 hours of receiving all information.  You will also receive your request decision electronically. To check for an update later, open this request again from your dashboard.  If [employer] has not responded within the specified timeframe or if you have any questions about your PA submission, contact [place] directly at [phone].

## 2021-01-27 NOTE — Progress Notes (Signed)
Vyvanse was denied by insurance.  Alternatives include: Generic Adderall XR, generic Focalin XR, Dexedrine, Methylphenidate ER Ritalin LA.  Please advise.

## 2021-01-31 MED ORDER — LISDEXAMFETAMINE DIMESYLATE 40 MG PO CAPS
ORAL_CAPSULE | ORAL | 0 refills | Status: DC
Start: 2021-01-31 — End: 2021-02-02

## 2021-01-31 NOTE — Telephone Encounter (Signed)
30 day rx sent 

## 2021-02-02 MED ORDER — METHYLPHENIDATE HCL ER (OSM) 36 MG PO TBCR: 36.0000 mg | EXTENDED_RELEASE_TABLET | Freq: Every day | ORAL | 0 refills | Status: DC

## 2021-02-02 NOTE — Telephone Encounter (Signed)
I took Vyvanse off the list and recommend 1 month trial of generic Concerta 36 mg once daily.  Prescription sent to pharmacy.  Give feedback in 1 month

## 2021-02-02 NOTE — Addendum Note (Signed)
Addended by: Eulas Post on: 02/02/2021 12:56 PM   Modules accepted: Orders

## 2021-02-14 ENCOUNTER — Ambulatory Visit (INDEPENDENT_AMBULATORY_CARE_PROVIDER_SITE_OTHER): Payer: BC Managed Care – PPO | Admitting: Nurse Practitioner

## 2021-02-14 ENCOUNTER — Encounter: Payer: Self-pay | Admitting: Nurse Practitioner

## 2021-02-14 ENCOUNTER — Other Ambulatory Visit: Payer: Self-pay

## 2021-02-14 VITALS — BP 138/82 | HR 84 | Ht 69.0 in | Wt 198.0 lb

## 2021-02-14 DIAGNOSIS — Z01419 Encounter for gynecological examination (general) (routine) without abnormal findings: Secondary | ICD-10-CM

## 2021-02-14 DIAGNOSIS — N939 Abnormal uterine and vaginal bleeding, unspecified: Secondary | ICD-10-CM

## 2021-02-14 DIAGNOSIS — Z30432 Encounter for removal of intrauterine contraceptive device: Secondary | ICD-10-CM | POA: Diagnosis not present

## 2021-02-14 DIAGNOSIS — E785 Hyperlipidemia, unspecified: Secondary | ICD-10-CM | POA: Diagnosis not present

## 2021-02-14 MED ORDER — MEGESTROL ACETATE 40 MG PO TABS: 40.0000 mg | ORAL_TABLET | Freq: Two times a day (BID) | ORAL | 0 refills | Status: DC

## 2021-02-14 NOTE — Patient Instructions (Signed)

## 2021-02-14 NOTE — Progress Notes (Signed)
   Jennifer Shepard 06/01/1966 [id number]   History:  55 y.o. [id number] presents for annual exam without GYN complaints. Mirena inserted 07/2015. [place] one year ago was 63.7. Normal pap and mammogram history.   Gynecologic History No LMP recorded. (Menstrual status: IUD).   Contraception/Family planning: IUD  Health Maintenance Last Pap: 11/25/2018. Results were: normal Last mammogram: 01/21/2020. Results were: normal Last colonoscopy: Never Last Dexa: N/A  Past medical history, past surgical history, family history and social history were all reviewed and documented in the EPIC chart.  ROS:  A ROS was performed and pertinent positives and negatives are included.  Exam:  Vitals:   02/14/21 0934  BP: 138/82  Pulse: 84  Weight: 198 lb (89.8 kg)  Height: 5\' 9"  (1.753 m)   Body mass index is 29.24 kg/m.  General appearance:  Normal Thyroid:  Symmetrical, normal in size, without palpable masses or nodularity. Respiratory  Auscultation:  Clear without wheezing or rhonchi Cardiovascular  Auscultation:  Regular rate, without rubs, murmurs or gallops  Edema/varicosities:  Not grossly evident Abdominal  Soft,nontender, without masses, guarding or rebound.  Liver/spleen:  No organomegaly noted  Hernia:  None appreciated  Skin  Inspection:  Grossly normal   Breasts: Examined lying and sitting.   Right: Without masses, retractions, discharge or axillary adenopathy.   Left: Without masses, retractions, discharge or axillary adenopathy. Gentitourinary   Inguinal/mons:  Normal without inguinal adenopathy  External genitalia:  Normal  BUS/Urethra/Skene's glands:  Normal  Vagina:  Normal  Cervix:  Normal, IUD string visible  Uterus:  Normal in size, shape and contour.  Midline and mobile  Adnexa/parametria:     Rt: Without masses or tenderness.   Lt: Without masses or tenderness.  Anus and perineum: Normal  Digital rectal exam: Normal sphincter tone without palpated masses or  tenderness  Assessment/Plan:  55 y.o. [id number] for annual exam.   Well female exam with routine gynecological exam - Plan: CBC with Differential/Platelet, Comprehensive metabolic panel, Lipid panel. Education provided on SBEs, importance of preventative screenings, current guidelines, high calcium diet, regular exercise, and multivitamin daily.  Abnormal uterine bleeding - Plan: megestrol (MEGACE) 40 MG tablet twice daily for heavy bleeding. She is going out of town in 3 days and is worried about cycles returning after IUD removal. She is aware to only take if needed.   Encounter for IUD removal - IUD removed, shown to patient, and discarded. [place] was 63 one year ago, so it is very likely cycles will not return.  Screening for cervical cancer - Normal Pap history.  Will repeat at 5-year interval per guidelines.  Screening for breast cancer - Normal mammogram history.  Continue annual screenings.  Normal breast exam today.  She plans to schedule mammogram soon.  Screening for colon cancer -has not had screening colonoscopy but did have consultation and plans to schedule this soon.  Follow up in 1 year for annual.     Tamela Gammon [facility], 10:00 AM 02/14/2021

## 2021-02-15 LAB — COMPREHENSIVE METABOLIC PANEL
AG Ratio: 1.8 (calc) (ref 1.0–2.5)
ALT: 14 U/L (ref 6–29)
AST: 17 U/L (ref 10–35)
Albumin: 4.5 g/dL (ref 3.6–5.1)
Alkaline phosphatase (APISO): 77 U/L (ref 37–153)
BUN: 12 mg/dL (ref 7–25)
CO2: 32 mmol/L (ref 20–32)
Calcium: 9.8 mg/dL (ref 8.6–10.4)
Chloride: 100 mmol/L (ref 98–110)
Creat: 0.71 mg/dL (ref 0.50–1.05)
Globulin: 2.5 g/dL (calc) (ref 1.9–3.7)
Glucose, Bld: 81 mg/dL (ref 65–99)
Potassium: 4.4 mmol/L (ref 3.5–5.3)
Sodium: 139 mmol/L (ref 135–146)
Total Bilirubin: 0.9 mg/dL (ref 0.2–1.2)
Total Protein: 7 g/dL (ref 6.1–8.1)

## 2021-02-15 LAB — LIPID PANEL
Cholesterol: 226 mg/dL — ABNORMAL HIGH (ref <200)
HDL: 96 mg/dL (ref >=50)
LDL Cholesterol (Calc): 115 mg/dL (calc) — ABNORMAL HIGH
Non-HDL Cholesterol (Calc): 130 mg/dL (calc) — ABNORMAL HIGH (ref <130)
Total CHOL/HDL Ratio: 2.4 (calc) (ref <5.0)
Triglycerides: 60 mg/dL (ref <150)

## 2021-02-15 LAB — CBC WITH DIFFERENTIAL/PLATELET
Absolute Monocytes: 494 cells/uL (ref 200–950)
Basophils Absolute: 61 cells/uL (ref 0–200)
Basophils Relative: 1.3 %
Eosinophils Absolute: 61 cells/uL (ref 15–500)
Eosinophils Relative: 1.3 %
HCT: 39 % (ref 35.0–45.0)
Hemoglobin: 13.5 g/dL (ref 11.7–15.5)
Lymphs Abs: 1542 cells/uL (ref 850–3900)
MCH: 32.6 pg (ref 27.0–33.0)
MCHC: 34.6 g/dL (ref 32.0–36.0)
MCV: 94.2 fL (ref 80.0–100.0)
MPV: 10.4 fL (ref 7.5–12.5)
Monocytes Relative: 10.5 %
Neutro Abs: 2543 cells/uL (ref 1500–7800)
Neutrophils Relative %: 54.1 %
Platelets: 289 10*3/uL (ref 140–400)
RBC: 4.14 10*6/uL (ref 3.80–5.10)
RDW: 12.1 % (ref 11.0–15.0)
Total Lymphocyte: 32.8 %
WBC: 4.7 10*3/uL (ref 3.8–10.8)

## 2021-02-28 DIAGNOSIS — D225 Melanocytic nevi of trunk: Secondary | ICD-10-CM | POA: Diagnosis not present

## 2021-02-28 DIAGNOSIS — B353 Tinea pedis: Secondary | ICD-10-CM | POA: Diagnosis not present

## 2021-02-28 DIAGNOSIS — L920 Granuloma annulare: Secondary | ICD-10-CM | POA: Diagnosis not present

## 2021-02-28 DIAGNOSIS — D229 Melanocytic nevi, unspecified: Secondary | ICD-10-CM | POA: Diagnosis not present

## 2021-03-21 ENCOUNTER — Encounter: Payer: Self-pay | Admitting: Family Medicine

## 2021-03-21 MED ORDER — METHYLPHENIDATE HCL ER (OSM) 36 MG PO TBCR: 36.0000 mg | EXTENDED_RELEASE_TABLET | Freq: Every day | ORAL | 0 refills | Status: DC

## 2021-03-21 NOTE — Telephone Encounter (Signed)
Send in 50-month refill for Concerta

## 2021-03-21 NOTE — Telephone Encounter (Signed)
The pharmacy would not accept this over the phone from me. Medication is pended. Please send electronically.

## 2021-03-21 NOTE — Addendum Note (Signed)
Addended by: Rebecca Eaton on: 03/21/2021 01:22 PM   Modules accepted: Orders

## 2021-03-21 NOTE — Telephone Encounter (Signed)
Sorry, my note should have said "sent in 3 month refill"    ie - I have already sent it in.

## 2021-03-21 NOTE — Telephone Encounter (Signed)
Last filled by a historical provider. Ok to fill?

## 2021-03-23 NOTE — Telephone Encounter (Signed)
Please deny this request to remove it from the basket.

## 2021-06-15 ENCOUNTER — Other Ambulatory Visit: Payer: Self-pay

## 2021-06-15 ENCOUNTER — Telehealth: Payer: Self-pay

## 2021-06-15 ENCOUNTER — Ambulatory Visit: Payer: BC Managed Care – PPO | Admitting: Nurse Practitioner

## 2021-06-15 VITALS — BP 134/74 | HR 69 | Ht 69.0 in | Wt 198.8 lb

## 2021-06-15 DIAGNOSIS — R3989 Other symptoms and signs involving the genitourinary system: Secondary | ICD-10-CM

## 2021-06-15 DIAGNOSIS — R1030 Lower abdominal pain, unspecified: Secondary | ICD-10-CM

## 2021-06-15 NOTE — Telephone Encounter (Signed)
Message received from front desk: Jonelle Sidle ordered an ultrasound first available for the [place] schedule is July 19.Patient not happy and wants to be referred out to get [place] sooner than July 19.   I called [facility] and scheduled patient for 06/30/21 at 2:00pm to check in at 1:45pm. [address], [address]. To have full bladder 32 ozs of water.  Detailed message was left in patient's voice mail with all info.  Tiffany, will you just send result note or call patient or do you want her to schedule visit with you for u/s result?

## 2021-06-15 NOTE — Progress Notes (Signed)
   Acute Office Visit  Subjective:    Patient ID: Jennifer Shepard, female    DOB: Oct 10, 1966, 55 y.o.   MRN: [id number]   HPI 55 y.o. presents today for lower abdominal pain that started 1 week ago. She says it is hard to describe and hurts most when sitting to urinate and it feels like a deep, stabbing pain in her pelvis. Denies urinary frequency, urgency, dysuria, hematuria, or back pain. Postmenopausal. No vaginal symptoms. Normal bowel movements.    Review of Systems  Constitutional: Negative.   Gastrointestinal:  Positive for abdominal pain. Negative for constipation, diarrhea, nausea and vomiting.  Genitourinary:  Positive for pelvic pain. Negative for dysuria, frequency, hematuria and urgency.      Objective:    Physical Exam Constitutional:      Appearance: Normal appearance.  Abdominal:     Tenderness: There is abdominal tenderness in the suprapubic area. There is guarding. There is no right CVA tenderness, left CVA tenderness or rebound.  Genitourinary:    General: Normal vulva.     Vagina: Normal.     Cervix: Normal.     Uterus: Tender. Not deviated, not enlarged and no uterine prolapse.     BP 134/74   Pulse 69   Ht 5\' 9"  (1.753 m)   Wt 198 lb 12.8 oz (90.2 kg)   SpO2 99%   BMI 29.36 kg/m  Wt Readings from Last 3 Encounters:  06/15/21 198 lb 12.8 oz (90.2 kg)  02/14/21 198 lb (89.8 kg)  02/10/20 193 lb (87.5 kg)   UA: trace leukocytes, negative nitrates, negative blood. Microscopic: wbc 6-10, rbc none seen, moderate bacteria     Assessment & Plan:   Problem List Items Addressed This Visit   None Visit Diagnoses     Lower abdominal pain    -  Primary   Relevant Orders   US PELVIS TRANSVAGINAL NON-OB (TV ONLY)   Sensation of pressure in bladder area       Relevant Orders   Urinalysis,Complete w/RFL Culture       Plan: UA unremarkable, culture pending. If negative, I provided her with the option to observe for a couple of weeks to see if pain  improves versus scheduling an ultrasound now. She would like to schedule ultrasound now and if it improves she will let me know prior. If pain becomes severe she is aware to go to ER for evaluation.     Tamela Gammon DNP, 1:29 PM 06/15/2021

## 2021-06-15 NOTE — Telephone Encounter (Signed)
I can just call or send a result message. If results require further follow up I will have her come in to discuss.

## 2021-06-16 NOTE — Telephone Encounter (Signed)
I also sent patient My Chart e-mails with all this information.

## 2021-06-17 LAB — URINE CULTURE
MICRO NUMBER:: 12037209
Result:: NO GROWTH
SPECIMEN QUALITY:: ADEQUATE

## 2021-06-17 LAB — URINALYSIS, COMPLETE W/RFL CULTURE
Bilirubin Urine: NEGATIVE
Glucose, UA: NEGATIVE
Hgb urine dipstick: NEGATIVE
Hyaline Cast: NONE SEEN /LPF
Ketones, ur: NEGATIVE
Nitrites, Initial: NEGATIVE
Protein, ur: NEGATIVE
RBC / HPF: NONE SEEN /HPF (ref 0–2)
Specific Gravity, Urine: 1.015 (ref 1.001–1.035)
pH: 7.5 (ref 5.0–8.0)

## 2021-06-17 LAB — CULTURE INDICATED

## 2021-06-24 ENCOUNTER — Telehealth: Payer: Self-pay | Admitting: Family Medicine

## 2021-06-24 NOTE — Telephone Encounter (Signed)
Last filled 03/21/2021 Last OV 08/03/2020  Refill ok?

## 2021-06-24 NOTE — Telephone Encounter (Signed)
methylphenidate (CONCERTA) 36 MG PO CR tablet  [id number] [address], [place] AT [address] Phone:  [phone]  Fax:  [phone]

## 2021-06-27 MED ORDER — METHYLPHENIDATE HCL ER (OSM) 36 MG PO TBCR: 36.0000 mg | EXTENDED_RELEASE_TABLET | Freq: Every day | ORAL | 0 refills | Status: DC

## 2021-06-27 NOTE — Telephone Encounter (Signed)
Will need follow up by August.

## 2021-06-27 NOTE — Addendum Note (Signed)
Addended by: Eulas Post on: 06/27/2021 12:20 PM   Modules accepted: Orders

## 2021-06-29 NOTE — Telephone Encounter (Signed)
ATC, unable to leave a message.  

## 2021-06-30 ENCOUNTER — Ambulatory Visit
Admission: RE | Admit: 2021-06-30 | Discharge: 2021-06-30 | Disposition: A | Discharge status: Home / Self Care | Payer: BC Managed Care – PPO | Source: Ambulatory Visit | Attending: Nurse Practitioner | Admitting: Nurse Practitioner

## 2021-06-30 DIAGNOSIS — R1030 Lower abdominal pain, unspecified: Secondary | ICD-10-CM

## 2021-06-30 DIAGNOSIS — D251 Intramural leiomyoma of uterus: Secondary | ICD-10-CM | POA: Diagnosis not present

## 2021-07-01 NOTE — Telephone Encounter (Signed)
ATC, unable to leave a voice mail.  

## 2021-07-04 ENCOUNTER — Telehealth: Payer: Self-pay | Admitting: Nurse Practitioner

## 2021-07-04 ENCOUNTER — Other Ambulatory Visit: Payer: Self-pay | Admitting: Nurse Practitioner

## 2021-07-04 DIAGNOSIS — R1030 Lower abdominal pain, unspecified: Secondary | ICD-10-CM

## 2021-07-04 NOTE — Telephone Encounter (Signed)
ATC, unable to leave a message. Unable to reach the patient.  Message will be closed.

## 2021-07-04 NOTE — Telephone Encounter (Signed)
Discussed 7/72022 ultrasound findings performed at [facility] with patient and inability to see ovaries. She is still having intermittent lower abdominal pain. Discussed the option to have ultrasound performed at our office to see if we can visualize ovaries versus MRI. She is aware that ovaries may not be seen again. She would like to schedule ultrasound before MRI.

## 2021-07-04 NOTE — Telephone Encounter (Signed)
Attempted to call patient to discuss ultrasound report and follow up on symptoms. No answer. Will try again later.

## 2021-07-12 ENCOUNTER — Other Ambulatory Visit: Payer: BC Managed Care – PPO | Admitting: Obstetrics and Gynecology

## 2021-07-12 ENCOUNTER — Other Ambulatory Visit: Payer: BC Managed Care – PPO

## 2021-07-19 DIAGNOSIS — M7581 Other shoulder lesions, right shoulder: Secondary | ICD-10-CM | POA: Diagnosis not present

## 2021-08-10 ENCOUNTER — Encounter: Payer: Self-pay | Admitting: Family Medicine

## 2021-08-12 MED ORDER — LISDEXAMFETAMINE DIMESYLATE 40 MG PO CAPS: 40.0000 mg | ORAL_CAPSULE | ORAL | 0 refills | Status: DC

## 2021-08-12 NOTE — Telephone Encounter (Signed)
Prescription for Vyvanse has been sent in.

## 2021-09-27 ENCOUNTER — Encounter: Payer: Self-pay | Admitting: Family Medicine

## 2021-09-27 ENCOUNTER — Other Ambulatory Visit: Payer: Self-pay | Admitting: Family Medicine

## 2021-09-27 DIAGNOSIS — I10 Essential (primary) hypertension: Secondary | ICD-10-CM | POA: Diagnosis not present

## 2021-09-28 ENCOUNTER — Telehealth: Payer: Self-pay

## 2021-09-28 MED ORDER — LISDEXAMFETAMINE DIMESYLATE 40 MG PO CAPS: 40.0000 mg | ORAL_CAPSULE | ORAL | 0 refills | Status: DC

## 2021-09-28 NOTE — Telephone Encounter (Signed)
Refilled the Vyvanse once for 30 days until follow up.

## 2021-09-28 NOTE — Telephone Encounter (Signed)
Last filled 08/12/2021 Last OV 08/02/2020  Ok to fill?

## 2021-09-28 NOTE — Telephone Encounter (Signed)
Left a detailed message on verified voice mail informing the patient that her medication has been sent to the pharmacy.

## 2021-09-28 NOTE — Telephone Encounter (Signed)
Patient called requesting Rx refill for either the  lisdexamfetamine (VYVANSE) 40 MG capsule Or  methylphenidate (CONCERTA) 36 MG PO CR tablet  Patient stated she is completely out and would like a call back to discuss update on PA

## 2021-09-28 NOTE — Telephone Encounter (Signed)
Last filled 08/12/2021 Last OV 08/03/2020  Ok to fill. Duplicate request?

## 2021-09-28 NOTE — Telephone Encounter (Signed)
Please advise. Jennifer Shepard for Rx for concerta until Vyvanse is approved?

## 2021-09-28 NOTE — Addendum Note (Signed)
Addended by: Eulas Post on: 09/28/2021 02:22 PM   Modules accepted: Orders

## 2021-09-28 NOTE — Telephone Encounter (Signed)
Please advise. Ok to use binge eating disorder as Dx?

## 2021-09-29 MED ORDER — METHYLPHENIDATE HCL ER (OSM) 36 MG PO TBCR: 36.0000 mg | EXTENDED_RELEASE_TABLET | Freq: Every day | ORAL | 0 refills | Status: DC

## 2021-09-29 NOTE — Telephone Encounter (Signed)
I did send in 30 day for Concerta but needs follow up .

## 2021-10-13 DIAGNOSIS — C73 Malignant neoplasm of thyroid gland: Secondary | ICD-10-CM | POA: Diagnosis not present

## 2021-10-17 DIAGNOSIS — C73 Malignant neoplasm of thyroid gland: Secondary | ICD-10-CM | POA: Diagnosis not present

## 2021-12-02 DIAGNOSIS — Z8379 Family history of other diseases of the digestive system: Secondary | ICD-10-CM | POA: Diagnosis not present

## 2021-12-02 DIAGNOSIS — Z1211 Encounter for screening for malignant neoplasm of colon: Secondary | ICD-10-CM | POA: Diagnosis not present

## 2021-12-12 ENCOUNTER — Ambulatory Visit: Payer: BC Managed Care – PPO | Admitting: Family Medicine

## 2021-12-12 VITALS — BP 140/80 | HR 88 | Temp 97.5°F | Wt 201.1 lb

## 2021-12-12 DIAGNOSIS — F5081 Binge eating disorder: Secondary | ICD-10-CM

## 2021-12-12 DIAGNOSIS — F900 Attention-deficit hyperactivity disorder, predominantly inattentive type: Secondary | ICD-10-CM | POA: Diagnosis not present

## 2021-12-12 MED ORDER — LISDEXAMFETAMINE DIMESYLATE 40 MG PO CAPS: 40.0000 mg | ORAL_CAPSULE | ORAL | 0 refills | Status: DC

## 2021-12-12 NOTE — Progress Notes (Signed)
Established Patient Office Visit  Subjective:  Patient ID: [name], female    DOB: December 20, 1966  Age: 55 y.o. MRN: [id number]  CC:  Chief Complaint  Patient presents with   discuss meds    HPI Jennifer Shepard presents for follow-up regarding attention deficit disorder.  For several years she took Vyvanse at 40 mg daily which seemed to be working well.  Because of insurance coverage issues she had to be switched to Concerta.  She has had some weight gain since going with Concerta.  She specifically is concerned about binge eating disorder.  She has periods for example of about 2 hours where she consumes more food than would typically be consumed under similar circumstances by other individuals.  There is some sense of lack of control.  She sometimes feels guilty after eating more and sometimes eats when she is not hungry.  She has had episodes of at least once per week for greater than 3 months.  No history of bulimia.  No history of anorexia nervosa.  She also feels like when she was taking Concerta as she did not focus as well she did with Vyvanse.  She has history of hypothyroidism and hypertension which has been managed elsewhere.  She states her home blood pressures been very well controlled.  She had 24-hour monitor over a year ago which showed good home control.  Past Medical History:  Diagnosis Date   Benign essential HTN    Cancer ([place])    Thyroid    Past Surgical History:  Procedure Laterality Date   APPENDECTOMY     BREAST CYST ASPIRATION Right pt unsure   no visible scar   CESAREAN SECTION  01/2004   CESAREAN SECTION  10/2005   INTRAUTERINE DEVICE INSERTION  06/26/2015   Mirena   LASIK     THYROIDECTOMY  07/2009   FOR THYROID CANCER; Dr Vicie Mutters    Family History  Problem Relation Age of Onset   Hypertension Mother    Hypertension Maternal Grandmother    CAD Maternal Grandmother        CBAG in 1s; pacer   Hyperlipidemia Father    Cancer Neg Hx     Diabetes Neg Hx    Stroke Neg Hx    Breast cancer Neg Hx     Social History   Socioeconomic History   Marital status: Divorced    Spouse name: Not on file   Number of children: Not on file   Years of education: Not on file   Highest education level: Not on file  Occupational History   Not on file  Tobacco Use   Smoking status: Never   Smokeless tobacco: Never  Vaping Use   Vaping Use: Never used  Substance and Sexual Activity   Alcohol use: Yes    Comment: SOCIALLY ONLY   Drug use: No   Sexual activity: Not Currently    Birth control/protection: I.U.D., None    Comment: Mirena IUD 07/23/2015-1st intercourse 55 yo-More than 5 partners  Other Topics Concern   Not on file  Social History Narrative   Not on file   Social Determinants of Health   Financial Resource Strain: Not on file  Food Insecurity: Not on file  Transportation Needs: Not on file  Physical Activity: Not on file  Stress: Not on file  Social Connections: Not on file  Intimate Partner Violence: Not on file    Outpatient Medications Prior to Visit  Medication Sig Dispense Refill  ARMOUR THYROID 60 MG tablet Take 120 mg by mouth daily.   0   cevimeline (EVOXAC) 30 MG capsule Take 30 mg by mouth 3 (three) times daily.     Cholecalciferol (VITAMIN D PO) Take 5,000 Units by mouth daily.      Lifitegrast (XIIDRA) 5 % SOLN Place 1 drop into both eyes 2 times daily.     metoprolol succinate (TOPROL-XL) 50 MG 24 hr tablet Take 1 tablet (50 mg total) by mouth daily. (Patient taking differently: Take 50 mg by mouth in the morning and at bedtime.) 30 tablet 0   Probiotic Product (PROBIOTIC DAILY PO) Take 1 tablet by mouth daily.     valACYclovir (VALTREX) 500 MG tablet Take 1 tablet (500 mg total) by mouth 2 (two) times daily as needed (Take 3-5 days for fever blisters). 30 tablet 1   verapamil (VERELAN PM) 240 MG 24 hr capsule Take 240 mg by mouth at bedtime.     lisdexamfetamine (VYVANSE) 40 MG capsule Take 1  capsule (40 mg total) by mouth every morning. 30 capsule 0   megestrol (MEGACE) 40 MG tablet Take 1 tablet (40 mg total) by mouth 2 (two) times daily. 20 tablet 0   methylphenidate (CONCERTA) 36 MG PO CR tablet Take 1 tablet (36 mg total) by mouth daily. 30 tablet 0   No facility-administered medications prior to visit.    Allergies  Allergen Reactions   Cefaclor Other (See Comments)    Angioedema of lips   Morphine And Related Other (See Comments)    Severe headache   Tyloxapol    Oxycodone-Acetaminophen Itching    itching    ROS Review of Systems  Constitutional:  Positive for unexpected weight change. Negative for fatigue.  Eyes:  Negative for visual disturbance.  Respiratory:  Negative for cough, chest tightness, shortness of breath and wheezing.   Cardiovascular:  Negative for chest pain, palpitations and leg swelling.  Neurological:  Negative for dizziness, seizures, syncope, weakness, light-headedness and headaches.     Objective:    Physical Exam Constitutional:      Appearance: She is well-developed.  Eyes:     Pupils: Pupils are equal, round, and reactive to light.  Neck:     Thyroid: No thyromegaly.     Vascular: No JVD.  Cardiovascular:     Rate and Rhythm: Normal rate and regular rhythm.     Heart sounds:    No gallop.  Pulmonary:     Effort: Pulmonary effort is normal. No respiratory distress.     Breath sounds: Normal breath sounds. No wheezing or rales.  Musculoskeletal:     Cervical back: Neck supple.  Neurological:     Mental Status: She is alert.    BP 140/80 (BP Location: Left Arm, Patient Position: Sitting, Cuff Size: Normal)    Pulse 88    Temp (!) 97.5 F (36.4 C) (Oral)    Wt 201 lb 1.6 oz (91.2 kg)    SpO2 97%    BMI 29.70 kg/m  Wt Readings from Last 3 Encounters:  12/12/21 201 lb 1.6 oz (91.2 kg)  06/15/21 198 lb 12.8 oz (90.2 kg)  02/14/21 198 lb (89.8 kg)     Health Maintenance Due  Topic Date Due   COVID-19 Vaccine (1) Never  done   Pneumococcal Vaccine 55-36 Years old (1 - PCV) Never done   Zoster Vaccines- Shingrix (1 of 2) Never done   COLONOSCOPY (Pts 45-85yrs Insurance coverage will need to be confirmed)  Never done   INFLUENZA VACCINE  07/25/2021   PAP SMEAR-Modifier  11/25/2021    There are no preventive care reminders to display for this patient.  Lab Results  Component Value Date   TSH 1.160 09/04/2019   Lab Results  Component Value Date   WBC 4.7 02/14/2021   HGB 13.5 02/14/2021   HCT 39.0 02/14/2021   MCV 94.2 02/14/2021   PLT 289 02/14/2021   Lab Results  Component Value Date   NA 139 02/14/2021   K 4.4 02/14/2021   CO2 32 02/14/2021   GLUCOSE 81 02/14/2021   BUN 12 02/14/2021   CREATININE 0.71 02/14/2021   BILITOT 0.9 02/14/2021   ALKPHOS 71 08/27/2018   AST 17 02/14/2021   ALT 14 02/14/2021   PROT 7.0 02/14/2021   ALBUMIN 4.2 08/27/2018   CALCIUM 9.8 02/14/2021   ANIONGAP 10 05/29/2017   GFR 93.78 08/27/2018   Lab Results  Component Value Date   CHOL 226 (H) 02/14/2021   Lab Results  Component Value Date   HDL 96 02/14/2021   Lab Results  Component Value Date   LDLCALC 115 (H) 02/14/2021   Lab Results  Component Value Date   TRIG 60 02/14/2021   Lab Results  Component Value Date   CHOLHDL 2.4 02/14/2021   Lab Results  Component Value Date   HGBA1C 5.2 08/27/2018      Assessment & Plan:   Problem List Items Addressed This Visit       Unprioritized   ADD (attention deficit disorder)   Other Visit Diagnoses     Binge eating disorder    -  Primary     Patient has history of ADD and has done well on Vyvanse in the past.  She also expresses concerns today regarding binge eating disorder.  We reviewed diagnostic criteria and she does meet this.  We would like to try to get back on Vyvanse 40 mg daily.  Her insurance will likely require prior authorization.  She has done well on Vyvanse in the past and had some fairly rapid weight gain when she was  switched to Concerta.  Meds ordered this encounter  Medications   lisdexamfetamine (VYVANSE) 40 MG capsule    Sig: Take 1 capsule (40 mg total) by mouth every morning.    Dispense:  90 capsule    Refill:  0    Follow-up: No follow-ups on file.    Carolann Littler, MD

## 2021-12-21 ENCOUNTER — Telehealth: Payer: Self-pay | Admitting: Family Medicine

## 2021-12-21 NOTE — Telephone Encounter (Signed)
Patient called because pharmacy is needing an authorization for an early refill for her lisdexamfetamine (VYVANSE) 40 MG capsule   Patient needs prescription to be filled today or tomorrow for a 30 day supply as she is leaving the country on 12/30    Please contact pharmacy  [facility] [phone] - [address], [place] - [address] AT [place] Phone:  [phone]  Fax:  [phone]       Please advise

## 2021-12-22 ENCOUNTER — Other Ambulatory Visit: Payer: Self-pay

## 2021-12-22 DIAGNOSIS — B009 Herpesviral infection, unspecified: Secondary | ICD-10-CM

## 2021-12-22 MED ORDER — VALACYCLOVIR HCL 500 MG PO TABS: 500.0000 mg | ORAL_TABLET | Freq: Two times a day (BID) | ORAL | 0 refills | Status: DC | PRN

## 2021-12-22 NOTE — Telephone Encounter (Signed)
Patient called office requesting a new prescription for Vyvanse 40mg , due to wiliness to pay cash for the prescription, because she is going out of town on 12/23/21, and will not be in town for 12/26/21 fill date.      Please advise

## 2021-12-22 NOTE — Telephone Encounter (Signed)
AEX 02/14/21 with Marny Lowenstein, NP.  Pharmacy called because patient requesting refill on Valtrex.

## 2021-12-22 NOTE — Telephone Encounter (Signed)
[place],  patient last filled Vyvanse 40mg  on 09/29/21.     Next refill due which will be 3 day earlier is Jan 2nd,  on that day the 12/12/21 prescription will be filled.

## 2021-12-22 NOTE — Telephone Encounter (Signed)
Patient called again to check on Vyvanse being filled today. After speaking with Sondra Barges I let patient know that Dr.Burchette would have to be the one to give conformation and he was out of the office today. Patient was upset and demanded that something be done about it. I let her know that there was nothing we could until Dr.Burchette came back on 12/30. Patient asked if there was any provider that could fill it and I told her no( I did ask Tommi Rumps who stated he would not be able to fill it). She asked me if I was saying no doctor could do anything for another doctor and I stated that she must have misunderstood me, in other situations yes, sometimes another provider can do things in the absence of one, but for her situation she would have to wait for Dr.Burchette. Patient stated that she was a patient of [facility] and asked if no other doctor could fill it. I let her know that we are apart of [facility] but her PCP is Dr.Burchette, patient stated that she knew this and I didn't need to tell her that. I told her that because he was her PCP in this situation we would need to wait for him to come back on 12/30. Patient became very upset and demanded something be done so her prescription could be picked up today. I reiterated the statement from [facility] and she continued to demand that something be done. After going back and forth repeating ourselves she asked to speak with someone else. I let Brittney know, who stated she would give a callback to patient after her meeting. I relayed this information to patient and she stated that if prescription was not sent today then it would need to be sent to the [facility].     Please advise

## 2021-12-22 NOTE — Telephone Encounter (Addendum)
Pharm is aware md out of office until 12-23-2021. Pharm said pt is not due for rx until 12-26-2021

## 2021-12-22 NOTE — Telephone Encounter (Signed)
Also wanted it noted that I did tell patient that it takes between two and three business days for refills and prescriptions in response to patient saying she called yesterday(12/28) to have this taken care of. Patient was unhappy with this response and stated she should not be penalized for Dr.Burchette taking thursdays off.

## 2021-12-23 MED ORDER — LISDEXAMFETAMINE DIMESYLATE 40 MG PO CAPS: 40.0000 mg | ORAL_CAPSULE | ORAL | 0 refills | Status: DC

## 2021-12-23 NOTE — Telephone Encounter (Signed)
Prescription sent.  This request for refill was promptly answered but the initial confusion was that she had new rx sent in 12-12-21 so we did not realize the 12-19 rx was too soon (and prescription from October could not be refilled early).

## 2021-12-23 NOTE — Telephone Encounter (Addendum)
Lvm for patient that new prescription for Vyvanse 40mg   has been sent to [facility] and to coordinate pick up with pharmacy.

## 2022-01-31 ENCOUNTER — Telehealth: Payer: Self-pay | Admitting: Family Medicine

## 2022-01-31 MED ORDER — LISDEXAMFETAMINE DIMESYLATE 40 MG PO CAPS: 40.0000 mg | ORAL_CAPSULE | ORAL | 0 refills | Status: DC

## 2022-01-31 NOTE — Telephone Encounter (Signed)
Patient called in requesting a refill for lisdexamfetamine (VYVANSE) 40 MG capsule [[id number]]  to be sent to her pharmacy.  Patient could be contacted at [phone].  Please advise.

## 2022-01-31 NOTE — Telephone Encounter (Signed)
Last refill- 12/23/21 Last OV- 12/12/21

## 2022-02-02 NOTE — Telephone Encounter (Signed)
Patient is now out of medication and is wanting someone to call her back today.

## 2022-02-03 NOTE — Telephone Encounter (Signed)
Left a detailed message on verified voice mail informing the patient we sent in a 90 day supply on 01/31/2022 and asked that she check with her pharmacy.

## 2022-02-03 NOTE — Telephone Encounter (Signed)
Pt is calling and medication needs PA pt has bcbs. Pt is out of medication and had to pay for 30 day supply

## 2022-02-06 NOTE — Telephone Encounter (Signed)
Left a detailed message on verified voice mail informing the patient her PA has been started and that I will reach out as soon as I hear from her insurance. Patient asked to call back if she has any questions.

## 2022-02-06 NOTE — Telephone Encounter (Signed)
PA sent to cover my meds.  Key: [id number]

## 2022-02-09 NOTE — Telephone Encounter (Signed)
PA has been denied. Denial letter has been placed in Dr. Anastasio Auerbach red folder.

## 2022-02-09 NOTE — Telephone Encounter (Signed)
Patient called in to check on the status of PA. She has been informed that it was denied and that the denial letter has been given to Dr. Elease Hashimoto to review when he returns to the office. She will also call her insurance to see what alternatives are covered.

## 2022-02-10 NOTE — Telephone Encounter (Signed)
Spoke with the patient. She would like a copy if the denial letter mailed to her. Copy has been placed in the mail.

## 2022-02-21 ENCOUNTER — Telehealth: Payer: Self-pay | Admitting: Family Medicine

## 2022-02-21 ENCOUNTER — Encounter: Payer: Self-pay | Admitting: Family Medicine

## 2022-02-21 MED ORDER — METHYLPHENIDATE HCL ER (OSM) 36 MG PO TBCR: 36.0000 mg | EXTENDED_RELEASE_TABLET | Freq: Every day | ORAL | 0 refills | Status: DC

## 2022-02-21 NOTE — Telephone Encounter (Signed)
Pt call and stated she need  a new RX for something  else because INS denies Vyvanse she want a call back when sent.

## 2022-02-21 NOTE — Telephone Encounter (Signed)
This has been an enormous problem between insurance denials of ADD medications and also national shortage of various medications. I do not have a list available currently of what her insurance covers but I will try sending in Concerta which I think we used once previously.     let me know if there is a problem regarding either coverage or availability of this.

## 2022-02-22 NOTE — Telephone Encounter (Signed)
Attempted to contact the patient on 2/28 and there was no answer.  Information below was sent via Mychart message on 2/28. ?

## 2022-02-24 MED ORDER — CONCERTA 36 MG PO TBCR: 36.0000 mg | EXTENDED_RELEASE_TABLET | Freq: Every day | ORAL | 0 refills | Status: DC

## 2022-02-24 NOTE — Telephone Encounter (Signed)
Concerta 36 mg brand-name sent to [facility] on [address] ?

## 2022-02-27 ENCOUNTER — Encounter: Payer: Self-pay | Admitting: Family Medicine

## 2022-02-27 ENCOUNTER — Telehealth: Payer: Self-pay | Admitting: Family Medicine

## 2022-02-27 MED ORDER — DEXMETHYLPHENIDATE HCL ER 25 MG PO CP24: 1.0000 | ORAL_CAPSULE | Freq: Every day | ORAL | 0 refills | Status: DC

## 2022-02-27 NOTE — Telephone Encounter (Signed)
Left a message for the pt to return my call.  

## 2022-02-27 NOTE — Telephone Encounter (Signed)
I  have sent in Rx for the Dexmethylphenidate ER capsule- one daily and will re-submit letter regarding the Vyvanse for binge eating.   We did clearly document the information about binge eating in last note.   ?

## 2022-02-27 NOTE — Telephone Encounter (Signed)
Jennifer Shepard with [employer] calling to inform that the patient RX for Concerta has been denied due to not on the Formulary list... It must be a formulary list (try first) if not an option med recs must state as to why the need for Concerta.  ? ?[phone] option 3, then option 1  ?

## 2022-02-27 NOTE — Addendum Note (Signed)
Addended by: Eulas Post on: 02/27/2022 07:41 AM ? ? Modules accepted: Orders ? ?

## 2022-02-28 NOTE — Telephone Encounter (Signed)
Left a message for the pt to return my call.  

## 2022-03-01 NOTE — Telephone Encounter (Signed)
ATC but could not leave vm due to mailbox being full 

## 2022-04-14 ENCOUNTER — Encounter: Payer: Self-pay | Admitting: Family Medicine

## 2022-05-05 ENCOUNTER — Ambulatory Visit: Payer: BC Managed Care – PPO | Admitting: Family Medicine

## 2022-05-05 ENCOUNTER — Ambulatory Visit (INDEPENDENT_AMBULATORY_CARE_PROVIDER_SITE_OTHER)
Admission: RE | Admit: 2022-05-05 | Discharge: 2022-05-05 | Disposition: A | Discharge status: Home / Self Care | Payer: BC Managed Care – PPO | Source: Ambulatory Visit | Attending: Family Medicine | Admitting: Family Medicine

## 2022-05-05 ENCOUNTER — Other Ambulatory Visit (INDEPENDENT_AMBULATORY_CARE_PROVIDER_SITE_OTHER): Payer: BC Managed Care – PPO

## 2022-05-05 ENCOUNTER — Encounter: Payer: Self-pay | Admitting: Family Medicine

## 2022-05-05 VITALS — BP 124/72 | HR 100 | Temp 97.8°F | Ht 69.0 in | Wt 212.7 lb

## 2022-05-05 DIAGNOSIS — R635 Abnormal weight gain: Secondary | ICD-10-CM

## 2022-05-05 DIAGNOSIS — E032 Hypothyroidism due to medicaments and other exogenous substances: Secondary | ICD-10-CM

## 2022-05-05 DIAGNOSIS — R072 Precordial pain: Secondary | ICD-10-CM | POA: Diagnosis not present

## 2022-05-05 DIAGNOSIS — R079 Chest pain, unspecified: Secondary | ICD-10-CM | POA: Diagnosis not present

## 2022-05-05 LAB — BASIC METABOLIC PANEL
BUN: 12 mg/dL (ref 6–23)
CO2: 31 mEq/L (ref 19–32)
Calcium: 9.9 mg/dL (ref 8.4–10.5)
Chloride: 102 mEq/L (ref 96–112)
Creatinine, Ser: 0.68 mg/dL (ref 0.40–1.20)
GFR: 98.58 mL/min (ref >60.00)
Glucose, Bld: 67 mg/dL — ABNORMAL LOW (ref 70–99)
Potassium: 4.3 mEq/L (ref 3.5–5.1)
Sodium: 142 mEq/L (ref 135–145)

## 2022-05-05 LAB — CBC WITH DIFFERENTIAL/PLATELET
Basophils Absolute: 0 10*3/uL (ref 0.0–0.1)
Basophils Relative: 0.8 % (ref 0.0–3.0)
Eosinophils Absolute: 0.1 10*3/uL (ref 0.0–0.7)
Eosinophils Relative: 1 % (ref 0.0–5.0)
HCT: 38 % (ref 36.0–46.0)
Hemoglobin: 13 g/dL (ref 12.0–15.0)
Lymphocytes Relative: 26 % (ref 12.0–46.0)
Lymphs Abs: 1.6 10*3/uL (ref 0.7–4.0)
MCHC: 34.3 g/dL (ref 30.0–36.0)
MCV: 93.3 fl (ref 78.0–100.0)
Monocytes Absolute: 0.6 10*3/uL (ref 0.1–1.0)
Monocytes Relative: 10.3 % (ref 3.0–12.0)
Neutro Abs: 3.7 10*3/uL (ref 1.4–7.7)
Neutrophils Relative %: 61.9 % (ref 43.0–77.0)
Platelets: 268 10*3/uL (ref 150.0–400.0)
RBC: 4.07 Mil/uL (ref 3.87–5.11)
RDW: 13.6 % (ref 11.5–15.5)
WBC: 6 10*3/uL (ref 4.0–10.5)

## 2022-05-05 LAB — LIPID PANEL
Cholesterol: 224 mg/dL — ABNORMAL HIGH (ref 0–200)
HDL: 95.7 mg/dL (ref >39.00)
LDL Cholesterol: 119 mg/dL — ABNORMAL HIGH (ref 0–99)
NonHDL: 127.99
Total CHOL/HDL Ratio: 2
Triglycerides: 44 mg/dL (ref 0.0–149.0)
VLDL: 8.8 mg/dL (ref 0.0–40.0)

## 2022-05-05 LAB — TSH: TSH: 1.38 u[IU]/mL (ref 0.35–5.50)

## 2022-05-05 LAB — HEPATIC FUNCTION PANEL
ALT: 15 U/L (ref 0–35)
AST: 15 U/L (ref 0–37)
Albumin: 4.3 g/dL (ref 3.5–5.2)
Alkaline Phosphatase: 83 U/L (ref 39–117)
Bilirubin, Direct: 0.1 mg/dL (ref 0.0–0.3)
Total Bilirubin: 0.6 mg/dL (ref 0.2–1.2)
Total Protein: 7.2 g/dL (ref 6.0–8.3)

## 2022-05-05 LAB — T4, FREE: Free T4: 0.71 ng/dL (ref 0.60–1.60)

## 2022-05-05 NOTE — Progress Notes (Addendum)
Established Patient Office Visit  Subjective   Patient ID: Jennifer Shepard, female    DOB: Oct 20, 1966  Age: 56 y.o. MRN: [id number]  Chief Complaint  Patient presents with   Chest Pain    Patient complains of chest pains, Patient reports pain when bending over and inhaling deeply, x1 week    HPI   Jennifer Shepard is seen with onset yesterday of some substernal chest pain.  Pain is midline and worse with bending over and certain movement.  She woke up with the pain.  Denies any GERD symptoms.  No dysphagia.  No odynophagia.  No exertional chest pain.  No radiation to the back, arm, or neck.  No pleuritic pain.  Denies any injury.  Minimal pain at this time.  No dyspnea with laying supine.  No family history of premature CAD.  She does have history of hypertension which is currently controlled.  Other issues she is very concerned regarding weight gain.  Particularly during the past couple years.  She has not engaged in any regular exercise nor specific dietary plan at this time.  She does have history of hypothyroidism and is on replacement.  No peripheral edema.  Requesting labs regarding her recent weight gain.  Past Medical History:  Diagnosis Date   Benign essential HTN    Cancer [facility])    Thyroid   Past Surgical History:  Procedure Laterality Date   APPENDECTOMY     BREAST CYST ASPIRATION Right pt unsure   no visible scar   CESAREAN SECTION  01/2004   CESAREAN SECTION  10/2005   INTRAUTERINE DEVICE INSERTION  06/26/2015   Mirena   LASIK     THYROIDECTOMY  07/2009   FOR THYROID CANCER; Dr Vicie Mutters    reports that she has never smoked. She has never used smokeless tobacco. She reports current alcohol use. She reports that she does not use drugs. family history includes CAD in her maternal grandmother; Hyperlipidemia in her father; Hypertension in her maternal grandmother and mother. Allergies  Allergen Reactions   Cefaclor Other (See Comments)    Angioedema of lips   Morphine And  Related Other (See Comments)    Severe headache   Tyloxapol    Oxycodone-Acetaminophen Itching    itching    Review of Systems  Constitutional:  Negative for fever and weight loss.  Respiratory:  Negative for cough, hemoptysis, shortness of breath and wheezing.   Cardiovascular:  Positive for chest pain. Negative for palpitations, orthopnea, leg swelling and PND.  Gastrointestinal:  Negative for abdominal pain.  Genitourinary:  Negative for dysuria.  Neurological:  Negative for dizziness and headaches.     Objective:     BP 124/72 (BP Location: Left Arm, Patient Position: Sitting, Cuff Size: Normal)   Pulse 100   Temp 97.8 F (36.6 C) (Oral)   Ht '5\' 9"'$  (1.753 m)   Wt 212 lb 11.2 oz (96.5 kg)   SpO2 100%   BMI 31.41 kg/m    Physical Exam Vitals reviewed.  Constitutional:      General: She is not in acute distress.    Appearance: She is well-developed. She is not ill-appearing.  Cardiovascular:     Rate and Rhythm: Normal rate and regular rhythm.     Heart sounds: Normal heart sounds. No murmur heard. Pulmonary:     Effort: Pulmonary effort is normal.     Breath sounds: Normal breath sounds. No decreased breath sounds, wheezing or rales.  Chest:     Chest  wall: No edema.  Musculoskeletal:     Cervical back: Neck supple.  Lymphadenopathy:     Cervical: No cervical adenopathy.  Neurological:     Mental Status: She is alert.     No results found for any visits on 05/05/22.    The 10-year ASCVD risk score (Arnett DK, et al., 2019) is: 1.5%    Assessment & Plan:   Problem List Items Addressed This Visit       Unprioritized   Hypothyroidism   Relevant Orders   TSH   T4, Free   Other Visit Diagnoses     Substernal chest pain    -  Primary   Relevant Orders   EKG 12-Lead (Completed)   DG Chest 2 View   CBC with Differential/Platelet   Weight gain       Relevant Orders   CBC with Differential/Platelet   Basic metabolic panel   Lipid panel    Hepatic function panel     Patient presents as above with atypical 1 day history of chest pain.  Not exertional.  Low cardiac risk.  EKG shows sinus rhythm with no acute changes. -Recommend observation.  Be in touch for any dyspnea, exertional symptoms, or any other concerns -Obtain PA and lateral chest x-ray to further assess -Obtain labs as above to assess her recent weight gain. -Did discuss healthy weight loss strategies with regular exercise and calorie counting  No follow-ups on file.    Carolann Littler, MD

## 2022-05-08 ENCOUNTER — Other Ambulatory Visit (INDEPENDENT_AMBULATORY_CARE_PROVIDER_SITE_OTHER): Payer: BC Managed Care – PPO

## 2022-05-08 DIAGNOSIS — R072 Precordial pain: Secondary | ICD-10-CM

## 2022-05-09 ENCOUNTER — Other Ambulatory Visit: Payer: BC Managed Care – PPO

## 2022-05-09 ENCOUNTER — Telehealth: Payer: Self-pay | Admitting: Family Medicine

## 2022-05-09 DIAGNOSIS — R071 Chest pain on breathing: Secondary | ICD-10-CM

## 2022-05-09 LAB — BASIC METABOLIC PANEL
BUN: 16 mg/dL (ref 6–23)
CO2: 26 mEq/L (ref 19–32)
Calcium: 9.7 mg/dL (ref 8.4–10.5)
Chloride: 104 mEq/L (ref 96–112)
Creatinine, Ser: 0.98 mg/dL (ref 0.40–1.20)
GFR: 65.37 mL/min (ref >60.00)
Glucose, Bld: 103 mg/dL — ABNORMAL HIGH (ref 70–99)
Potassium: 3.9 mEq/L (ref 3.5–5.1)
Sodium: 139 mEq/L (ref 135–145)

## 2022-05-09 LAB — D-DIMER, QUANTITATIVE: D-Dimer, Quant: 0.75 mcg/mL FEU — ABNORMAL HIGH (ref <0.50)

## 2022-05-09 NOTE — Telephone Encounter (Signed)
Pt alarmed at lab results, D Dimer test has her worried and she would like to speak with someone asap. ?

## 2022-05-09 NOTE — Telephone Encounter (Signed)
Spoke with patient.  Elevated D-dimer but relatively low range.  We discussed nonspecificity of D-dimer.  However, her chest pain and some pleuritic pain persist.  We will obtain CT angiogram chest.  Patient knows she has increased dyspnea or worsening pain to go immediately to the ER or call 911. ?

## 2022-05-10 ENCOUNTER — Encounter: Payer: Self-pay | Admitting: Family Medicine

## 2022-05-11 ENCOUNTER — Other Ambulatory Visit: Payer: BC Managed Care – PPO

## 2022-05-11 ENCOUNTER — Ambulatory Visit
Admission: RE | Admit: 2022-05-11 | Discharge: 2022-05-11 | Disposition: A | Discharge status: Home / Self Care | Payer: BC Managed Care – PPO | Source: Ambulatory Visit | Attending: Family Medicine | Admitting: Family Medicine

## 2022-05-11 DIAGNOSIS — E039 Hypothyroidism, unspecified: Secondary | ICD-10-CM | POA: Diagnosis not present

## 2022-05-11 DIAGNOSIS — I712 Thoracic aortic aneurysm, without rupture, unspecified: Secondary | ICD-10-CM | POA: Diagnosis not present

## 2022-05-11 DIAGNOSIS — I728 Aneurysm of other specified arteries: Secondary | ICD-10-CM | POA: Diagnosis not present

## 2022-05-11 DIAGNOSIS — E890 Postprocedural hypothyroidism: Secondary | ICD-10-CM | POA: Diagnosis not present

## 2022-05-11 DIAGNOSIS — R635 Abnormal weight gain: Secondary | ICD-10-CM | POA: Diagnosis not present

## 2022-05-11 DIAGNOSIS — R071 Chest pain on breathing: Secondary | ICD-10-CM

## 2022-05-11 DIAGNOSIS — J984 Other disorders of lung: Secondary | ICD-10-CM | POA: Diagnosis not present

## 2022-05-11 DIAGNOSIS — I872 Venous insufficiency (chronic) (peripheral): Secondary | ICD-10-CM | POA: Diagnosis not present

## 2022-05-11 DIAGNOSIS — F988 Other specified behavioral and emotional disorders with onset usually occurring in childhood and adolescence: Secondary | ICD-10-CM | POA: Diagnosis not present

## 2022-05-11 MED ORDER — IOPAMIDOL (ISOVUE-370) INJECTION 76%
75.0000 mL | Freq: Once | INTRAVENOUS | Status: AC | PRN
  Administered 2022-05-11: 75 mL via INTRAVENOUS

## 2022-05-12 DIAGNOSIS — M5030 Other cervical disc degeneration, unspecified cervical region: Secondary | ICD-10-CM | POA: Diagnosis not present

## 2022-05-16 ENCOUNTER — Telehealth: Payer: Self-pay | Admitting: Family Medicine

## 2022-05-16 ENCOUNTER — Other Ambulatory Visit: Payer: Self-pay

## 2022-05-16 DIAGNOSIS — R071 Chest pain on breathing: Secondary | ICD-10-CM

## 2022-05-16 NOTE — Telephone Encounter (Signed)
Pt is returning mykal call from 05-15-2021 maybe in regards to CT result

## 2022-05-16 NOTE — Telephone Encounter (Signed)
Please see result note 

## 2022-05-24 ENCOUNTER — Telehealth: Payer: Self-pay | Admitting: Family Medicine

## 2022-05-24 NOTE — Telephone Encounter (Signed)
Pt is calling and would like a refill on lisdexamfetamine (VYVANSE) 40 MG capsule  [facility] #[id number] [address], [place] AT [place] Phone:  [phone]  Fax:  [phone]

## 2022-05-25 MED ORDER — LISDEXAMFETAMINE DIMESYLATE 40 MG PO CAPS: 40.0000 mg | ORAL_CAPSULE | ORAL | 0 refills | Status: DC

## 2022-05-30 ENCOUNTER — Telehealth: Payer: Self-pay | Admitting: Family Medicine

## 2022-05-30 NOTE — Telephone Encounter (Signed)
Pt is calling and she is missing 30 lisdexamfetamine (VYVANSE) 40 MG capsule. . Pt would like cma to return her call she said pharm will give her #30 if md sent another rx to  [facility] [phone] - [place], [place] - [address] AT [place] Phone:  [phone]  Fax:  [phone]

## 2022-05-30 NOTE — Telephone Encounter (Signed)
I called the pt and she informed me that pharmacy gave her the incorrect number of tablets as she does not have the correct amount and she cannot find missing tablets. I then spoke with the pt's pharmacy and they stated the pt picked up rx on 03/31/2022 for 90 day and they will not fill due pt requesting new rx early. Robert with [facility] informed me that he conducted a count of medication that was given to the pt and he stated the correct number of tablets was provided to the pt. Pt informed Herbie Baltimore that she may have misplaced medication as she cannot locate rx. Please advise

## 2022-05-31 MED ORDER — LISDEXAMFETAMINE DIMESYLATE 40 MG PO CAPS: 40.0000 mg | ORAL_CAPSULE | ORAL | 0 refills | Status: DC

## 2022-05-31 NOTE — Telephone Encounter (Signed)
I will refill this time, but we are generally not supposed to refill controlled medications early.

## 2022-05-31 NOTE — Telephone Encounter (Addendum)
Pt informed of the message and verbalized understanding and aware rx was sent

## 2022-05-31 NOTE — Telephone Encounter (Signed)
Pt is calling and said she is out of medication and checking on the below message. Pt is aware md is out of office tomorrow

## 2022-07-11 ENCOUNTER — Other Ambulatory Visit: Payer: Self-pay | Admitting: Family Medicine

## 2022-07-11 DIAGNOSIS — Z1231 Encounter for screening mammogram for malignant neoplasm of breast: Secondary | ICD-10-CM

## 2022-07-14 DIAGNOSIS — Z1231 Encounter for screening mammogram for malignant neoplasm of breast: Secondary | ICD-10-CM

## 2022-08-07 ENCOUNTER — Telehealth: Payer: Self-pay | Admitting: Family Medicine

## 2022-08-07 MED ORDER — LISDEXAMFETAMINE DIMESYLATE 40 MG PO CAPS: 40.0000 mg | ORAL_CAPSULE | ORAL | 0 refills | Status: DC

## 2022-08-07 NOTE — Telephone Encounter (Signed)
Pt requesting refill lisdexamfetamine (VYVANSE) 40 MG capsule   [facility] #[id number] [address], [place] AT [address] Phone:  [phone]  Fax:  [phone]

## 2022-08-07 NOTE — Addendum Note (Signed)
Addended by: Eulas Post on: 08/07/2022 08:39 PM   Modules accepted: Orders

## 2022-08-14 ENCOUNTER — Telehealth: Payer: Self-pay | Admitting: Family Medicine

## 2022-08-14 NOTE — Telephone Encounter (Signed)
Pt is calling and would like lisdexamfetamine (VYVANSE) 40 MG capsule #11 capsule send to  [facility] [place], [place] AT [place] Phone:  [phone]  Fax:  [phone]    Pt said her cleaning people stole her medication and its to early to refill the medication. Pt will pay out of pocket. Pt would like a callback

## 2022-08-15 NOTE — Telephone Encounter (Signed)
Pt called in requesting to speak with care team. Pls give patient a call.

## 2022-08-15 NOTE — Telephone Encounter (Signed)
I spoke with the patient and informed her of the message. Patient inquired if it is ok for her to go 11 days without medication before next refill? Patient also requested a  message be sent back to see if PCP can reconsider sending in refill for medication.

## 2022-08-16 NOTE — Telephone Encounter (Signed)
Patient informed of the message and verbalized understanding 

## 2022-08-16 NOTE — Telephone Encounter (Signed)
Left a message for the pt to return my call.  

## 2022-09-02 DIAGNOSIS — M545 Low back pain, unspecified: Secondary | ICD-10-CM | POA: Diagnosis not present

## 2022-09-02 DIAGNOSIS — R0781 Pleurodynia: Secondary | ICD-10-CM | POA: Diagnosis not present

## 2022-09-20 DIAGNOSIS — R7309 Other abnormal glucose: Secondary | ICD-10-CM | POA: Diagnosis not present

## 2022-09-20 DIAGNOSIS — C73 Malignant neoplasm of thyroid gland: Secondary | ICD-10-CM | POA: Diagnosis not present

## 2022-09-20 DIAGNOSIS — Z85850 Personal history of malignant neoplasm of thyroid: Secondary | ICD-10-CM | POA: Diagnosis not present

## 2022-09-22 ENCOUNTER — Other Ambulatory Visit: Payer: Self-pay | Admitting: Family Medicine

## 2022-09-22 DIAGNOSIS — Z1231 Encounter for screening mammogram for malignant neoplasm of breast: Secondary | ICD-10-CM

## 2022-09-25 NOTE — Progress Notes (Signed)
   Jennifer Shepard [birth year] [id number]   History:  56 y.o. [id number] presents for annual exam without GYN complaints. IUD removed last year, no bleeding since. Normal pap history. HTN managed by PCP, H/O thyroid cancer, managed by endocrinology.   Gynecologic History No LMP recorded (exact date). Patient is postmenopausal.   Contraception/Family planning: post menopausal status Sexually active: No  Health Maintenance Last Pap: 11/25/2018. Results were: Normal, 5-year repeat Last mammogram: 01/21/2020. Results were: Normal. Scheduled 10/19 Last colonoscopy: 12/02/2021. Results: Normal, 10-year recall Last Dexa: Not indicated  Past medical history, past surgical history, family history and social history were all reviewed and documented in the EPIC chart. Works in Insurance underwriter. 42 yo daughter at [employer], 54 yo son.   ROS:  A ROS was performed and pertinent positives and negatives are included.  Exam:  Vitals:   09/26/22 0936  BP: 124/82  Weight: 210 lb (95.3 kg)  Height: '5\' 9"'$  (1.753 m)    Body mass index is 31.01 kg/m.  General appearance:  Normal Thyroid:  Symmetrical, normal in size, without palpable masses or nodularity. Respiratory  Auscultation:  Clear without wheezing or rhonchi Cardiovascular  Auscultation:  Regular rate, without rubs, murmurs or gallops  Edema/varicosities:  Not grossly evident Abdominal  Soft,nontender, without masses, guarding or rebound.  Liver/spleen:  No organomegaly noted  Hernia:  None appreciated  Skin  Inspection:  Grossly normal   Breasts: Examined lying and sitting.   Right: Without masses, retractions, discharge or axillary adenopathy.   Left: Without masses, retractions, discharge or axillary adenopathy. Genitourinary   Inguinal/mons:  Normal without inguinal adenopathy  External genitalia:  Normal appearing vulva with no masses, tenderness, or lesions  BUS/Urethra/Skene's glands:  Normal  Vagina:  Normal appearing with normal color  and discharge, no lesions  Cervix:  Normal appearing without discharge or lesions  Uterus:  Normal in size, shape and contour.  Midline and mobile, nontender  Adnexa/parametria:     Rt: Normal in size, without masses or tenderness.   Lt: Normal in size, without masses or tenderness.  Anus and perineum: Normal  Digital rectal exam: Deferred  Patient informed chaperone available to be present for breast and pelvic exam. Patient has requested no chaperone to be present. Patient has been advised what will be completed during breast and pelvic exam.   Assessment/Plan:  56 y.o. [id number] for annual exam.   Well female exam with routine gynecological exam - Education provided on SBEs, importance of preventative screenings, current guidelines, high calcium diet, regular exercise, and multivitamin daily. Labs with PCP, endocrinology.   Postmenopausal - no HRT, no bleeding  Screening for cervical cancer - Normal Pap history.  Will repeat at 5-year interval per guidelines.  Screening for breast cancer - Normal mammogram history.  Overdue, scheduled 10/19. Normal breast exam today.   Screening for colon cancer - Colonoscopy 11/2021. Will repeat at GI's recommended interval.   Follow up in 1 year for annual.     Tamela Gammon [facility], 9:57 AM 09/26/2022

## 2022-09-26 ENCOUNTER — Encounter: Payer: Self-pay | Admitting: Nurse Practitioner

## 2022-09-26 ENCOUNTER — Ambulatory Visit (INDEPENDENT_AMBULATORY_CARE_PROVIDER_SITE_OTHER): Payer: BC Managed Care – PPO | Admitting: Nurse Practitioner

## 2022-09-26 VITALS — BP 124/82 | Ht 69.0 in | Wt 210.0 lb

## 2022-09-26 DIAGNOSIS — Z01419 Encounter for gynecological examination (general) (routine) without abnormal findings: Secondary | ICD-10-CM | POA: Diagnosis not present

## 2022-09-26 DIAGNOSIS — Z780 Asymptomatic menopausal state: Secondary | ICD-10-CM

## 2022-10-10 ENCOUNTER — Encounter: Payer: Self-pay | Admitting: Nurse Practitioner

## 2022-10-10 ENCOUNTER — Ambulatory Visit: Payer: BC Managed Care – PPO | Admitting: Nurse Practitioner

## 2022-10-10 VITALS — BP 158/98 | HR 68 | Resp 12 | Ht 69.0 in | Wt 213.0 lb

## 2022-10-10 DIAGNOSIS — Z113 Encounter for screening for infections with a predominantly sexual mode of transmission: Secondary | ICD-10-CM

## 2022-10-10 DIAGNOSIS — R300 Dysuria: Secondary | ICD-10-CM | POA: Diagnosis not present

## 2022-10-10 DIAGNOSIS — B3731 Acute candidiasis of vulva and vagina: Secondary | ICD-10-CM

## 2022-10-10 DIAGNOSIS — N9089 Other specified noninflammatory disorders of vulva and perineum: Secondary | ICD-10-CM | POA: Diagnosis not present

## 2022-10-10 LAB — WET PREP FOR TRICH, YEAST, CLUE

## 2022-10-10 MED ORDER — FLUCONAZOLE 150 MG PO TABS: 150.0000 mg | ORAL_TABLET | ORAL | 0 refills | Status: DC

## 2022-10-10 NOTE — Progress Notes (Signed)
   Acute Office Visit  Subjective:    Patient ID: Jennifer Shepard, female    DOB: 04/24/1966, 56 y.o.   MRN: [id number]   HPI 56 y.o. presents today for vulvar irritation and discharge. New partner. Would like STD screening today, declines HIV/RPR.    Review of Systems  Constitutional: Negative.   Genitourinary:  Positive for dysuria, vaginal discharge and vaginal pain (Vulvar irritation). Negative for flank pain, frequency, genital sores, hematuria and urgency.       Objective:    Physical Exam Constitutional:      Appearance: Normal appearance.  Genitourinary:    General: Normal vulva.     Vagina: Vaginal discharge present. No erythema.     Cervix: Normal.     BP (!) 158/98   Pulse 68   Resp 12   Ht '5\' 9"'$  (1.753 m)   Wt 213 lb (96.6 kg)   LMP  (LMP Unknown)   BMI 31.45 kg/m  Wt Readings from Last 3 Encounters:  10/10/22 213 lb (96.6 kg)  09/26/22 210 lb (95.3 kg)  05/05/22 212 lb 11.2 oz (96.5 kg)        Patient informed chaperone available to be present for breast and/or pelvic exam. Patient has requested no chaperone to be present. Patient has been advised what will be completed during breast and pelvic exam.   UA negative Wet prep + yeast  Assessment & Plan:   Problem List Items Addressed This Visit   None Visit Diagnoses     Vaginal candidiasis    -  Primary   Relevant Medications   fluconazole (DIFLUCAN) 150 MG tablet   Screening examination for STD (sexually transmitted disease)       Relevant Orders   C. trachomatis/N. gonorrhoeae RNA   Vulvar irritation       Relevant Orders   Urinalysis,Complete w/RFL Culture   WET PREP FOR TRICH, YEAST, CLUE      Plan: Wet prep positive for yeast - Diflucan 150 mg to day and repeat in 3 days if symptoms persist for total of 2 doses. GC/CT pending.      Tamela Gammon DNP, 12:07 PM 10/10/2022

## 2022-10-11 ENCOUNTER — Encounter: Payer: Self-pay | Admitting: Nurse Practitioner

## 2022-10-11 LAB — C. TRACHOMATIS/N. GONORRHOEAE RNA
C. trachomatis RNA, TMA: NOT DETECTED
N. gonorrhoeae RNA, TMA: NOT DETECTED

## 2022-10-12 DIAGNOSIS — Z1231 Encounter for screening mammogram for malignant neoplasm of breast: Secondary | ICD-10-CM

## 2022-10-19 LAB — URINALYSIS, COMPLETE W/RFL CULTURE
Bacteria, UA: NONE SEEN /HPF
Bilirubin Urine: NEGATIVE
Glucose, UA: NEGATIVE
Hgb urine dipstick: NEGATIVE
Hyaline Cast: NONE SEEN /LPF
Ketones, ur: NEGATIVE
Leukocyte Esterase: NEGATIVE
Nitrites, Initial: NEGATIVE
Protein, ur: NEGATIVE
RBC / HPF: NONE SEEN /HPF (ref 0–2)
Specific Gravity, Urine: 1.01 (ref 1.001–1.035)
WBC, UA: NONE SEEN /HPF (ref 0–5)
pH: 7 (ref 5.0–8.0)

## 2022-10-19 LAB — NO CULTURE INDICATED

## 2022-10-24 DIAGNOSIS — S92355A Nondisplaced fracture of fifth metatarsal bone, left foot, initial encounter for closed fracture: Secondary | ICD-10-CM | POA: Diagnosis not present

## 2022-11-01 DIAGNOSIS — S92355D Nondisplaced fracture of fifth metatarsal bone, left foot, subsequent encounter for fracture with routine healing: Secondary | ICD-10-CM | POA: Diagnosis not present

## 2022-11-08 DIAGNOSIS — S92355D Nondisplaced fracture of fifth metatarsal bone, left foot, subsequent encounter for fracture with routine healing: Secondary | ICD-10-CM | POA: Diagnosis not present

## 2022-11-21 ENCOUNTER — Telehealth: Payer: Self-pay | Admitting: Family Medicine

## 2022-11-21 MED ORDER — LISDEXAMFETAMINE DIMESYLATE 40 MG PO CAPS: 40.0000 mg | ORAL_CAPSULE | ORAL | 0 refills | Status: DC

## 2022-11-21 NOTE — Telephone Encounter (Signed)
Pt is calling and would like a refill on dexamfetamine (VYVANSE) 40 MG capsule  [facility] [phone] - [place], [place] - [place] [address] Phone: [phone]  Fax: [phone]

## 2022-11-27 DIAGNOSIS — S92355D Nondisplaced fracture of fifth metatarsal bone, left foot, subsequent encounter for fracture with routine healing: Secondary | ICD-10-CM | POA: Diagnosis not present

## 2022-12-12 DIAGNOSIS — S92355A Nondisplaced fracture of fifth metatarsal bone, left foot, initial encounter for closed fracture: Secondary | ICD-10-CM | POA: Diagnosis not present

## 2022-12-12 DIAGNOSIS — E669 Obesity, unspecified: Secondary | ICD-10-CM | POA: Diagnosis not present

## 2022-12-12 DIAGNOSIS — E7800 Pure hypercholesterolemia, unspecified: Secondary | ICD-10-CM | POA: Diagnosis not present

## 2022-12-12 DIAGNOSIS — N951 Menopausal and female climacteric states: Secondary | ICD-10-CM | POA: Diagnosis not present

## 2022-12-12 DIAGNOSIS — Z1329 Encounter for screening for other suspected endocrine disorder: Secondary | ICD-10-CM | POA: Diagnosis not present

## 2022-12-12 DIAGNOSIS — Z131 Encounter for screening for diabetes mellitus: Secondary | ICD-10-CM | POA: Diagnosis not present

## 2022-12-12 DIAGNOSIS — Z130 Encounter for screening for diseases of the blood and blood-forming organs and certain disorders involving the immune mechanism: Secondary | ICD-10-CM | POA: Diagnosis not present

## 2022-12-13 ENCOUNTER — Telehealth: Payer: Self-pay | Admitting: Family Medicine

## 2022-12-13 ENCOUNTER — Other Ambulatory Visit: Payer: Self-pay | Admitting: Family Medicine

## 2022-12-13 MED ORDER — ALPRAZOLAM 0.5 MG PO TABS: ORAL_TABLET | ORAL | 0 refills | Status: DC

## 2022-12-13 NOTE — Telephone Encounter (Signed)
Wants to speak with someone, she is flying next week and anxious about it. Requesting something to help with her flight anxiety.

## 2022-12-15 DIAGNOSIS — N951 Menopausal and female climacteric states: Secondary | ICD-10-CM | POA: Diagnosis not present

## 2022-12-15 DIAGNOSIS — R6882 Decreased libido: Secondary | ICD-10-CM | POA: Diagnosis not present

## 2022-12-15 DIAGNOSIS — R232 Flushing: Secondary | ICD-10-CM | POA: Diagnosis not present

## 2022-12-15 DIAGNOSIS — Z6830 Body mass index (BMI) 30.0-30.9, adult: Secondary | ICD-10-CM | POA: Diagnosis not present

## 2022-12-15 DIAGNOSIS — Z1339 Encounter for screening examination for other mental health and behavioral disorders: Secondary | ICD-10-CM | POA: Diagnosis not present

## 2022-12-15 DIAGNOSIS — Z1331 Encounter for screening for depression: Secondary | ICD-10-CM | POA: Diagnosis not present

## 2022-12-15 NOTE — Telephone Encounter (Signed)
Patient notified of update  and verbalized understanding. 

## 2022-12-20 DIAGNOSIS — S92355D Nondisplaced fracture of fifth metatarsal bone, left foot, subsequent encounter for fracture with routine healing: Secondary | ICD-10-CM | POA: Diagnosis not present

## 2023-01-05 ENCOUNTER — Other Ambulatory Visit: Payer: Self-pay | Admitting: Nurse Practitioner

## 2023-01-05 ENCOUNTER — Encounter: Payer: Self-pay | Admitting: Family Medicine

## 2023-01-05 DIAGNOSIS — Z1231 Encounter for screening mammogram for malignant neoplasm of breast: Secondary | ICD-10-CM

## 2023-01-08 ENCOUNTER — Other Ambulatory Visit (HOSPITAL_COMMUNITY): Payer: Self-pay

## 2023-01-08 NOTE — Telephone Encounter (Signed)
Patient Advocate Encounter   Received notification from [facility] that prior authorization for Vyvanse '40mg'$  caps is required.   PA submitted on 01/08/2023 Key [id number] Status is pending

## 2023-01-08 NOTE — Telephone Encounter (Signed)
Noted  

## 2023-01-11 ENCOUNTER — Other Ambulatory Visit (HOSPITAL_COMMUNITY): Payer: Self-pay

## 2023-01-11 NOTE — Telephone Encounter (Signed)
Noted  

## 2023-01-11 NOTE — Telephone Encounter (Signed)
PA was submitted and denied for brand name, attempting a PA on the generic. Sorry for the delay New Key: [id number]

## 2023-01-12 ENCOUNTER — Other Ambulatory Visit (HOSPITAL_COMMUNITY): Payer: Self-pay

## 2023-01-12 NOTE — Telephone Encounter (Signed)
Looks like it was approved for the generic earlier this year and should be good until next year per PA insurance advocate.

## 2023-01-17 DIAGNOSIS — S92355D Nondisplaced fracture of fifth metatarsal bone, left foot, subsequent encounter for fracture with routine healing: Secondary | ICD-10-CM | POA: Diagnosis not present

## 2023-01-18 ENCOUNTER — Ambulatory Visit
Admission: RE | Admit: 2023-01-18 | Discharge: 2023-01-18 | Disposition: A | Discharge status: Home / Self Care | Payer: BC Managed Care – PPO | Source: Ambulatory Visit

## 2023-01-18 DIAGNOSIS — Z1231 Encounter for screening mammogram for malignant neoplasm of breast: Secondary | ICD-10-CM

## 2023-01-28 MED ORDER — AMPHETAMINE-DEXTROAMPHET ER 20 MG PO CP24: 20.0000 mg | ORAL_CAPSULE | ORAL | 0 refills | Status: DC

## 2023-01-28 NOTE — Telephone Encounter (Signed)
I will send in trial of Adderall XR 20 mg once daily for one month.   Give feedback in a few weeks after starting.     Eulas Post MD [facility]

## 2023-01-28 NOTE — Addendum Note (Signed)
Addended by: Eulas Post on: 01/28/2023 07:50 PM   Modules accepted: Orders

## 2023-02-01 DIAGNOSIS — F988 Other specified behavioral and emotional disorders with onset usually occurring in childhood and adolescence: Secondary | ICD-10-CM | POA: Diagnosis not present

## 2023-02-01 DIAGNOSIS — G43009 Migraine without aura, not intractable, without status migrainosus: Secondary | ICD-10-CM | POA: Diagnosis not present

## 2023-02-01 DIAGNOSIS — I872 Venous insufficiency (chronic) (peripheral): Secondary | ICD-10-CM | POA: Diagnosis not present

## 2023-02-01 DIAGNOSIS — I82452 Acute embolism and thrombosis of left peroneal vein: Secondary | ICD-10-CM | POA: Diagnosis not present

## 2023-02-01 DIAGNOSIS — C73 Malignant neoplasm of thyroid gland: Secondary | ICD-10-CM | POA: Diagnosis not present

## 2023-02-01 DIAGNOSIS — D6859 Other primary thrombophilia: Secondary | ICD-10-CM | POA: Diagnosis not present

## 2023-02-13 DIAGNOSIS — I82452 Acute embolism and thrombosis of left peroneal vein: Secondary | ICD-10-CM | POA: Diagnosis not present

## 2023-02-22 DIAGNOSIS — H04123 Dry eye syndrome of bilateral lacrimal glands: Secondary | ICD-10-CM | POA: Diagnosis not present

## 2023-02-22 DIAGNOSIS — H16143 Punctate keratitis, bilateral: Secondary | ICD-10-CM | POA: Diagnosis not present

## 2023-02-22 DIAGNOSIS — H2513 Age-related nuclear cataract, bilateral: Secondary | ICD-10-CM | POA: Diagnosis not present

## 2023-02-22 DIAGNOSIS — H43812 Vitreous degeneration, left eye: Secondary | ICD-10-CM | POA: Diagnosis not present

## 2023-03-05 ENCOUNTER — Telehealth: Payer: Self-pay | Admitting: Family Medicine

## 2023-03-05 MED ORDER — AMPHETAMINE-DEXTROAMPHET ER 20 MG PO CP24: 20.0000 mg | ORAL_CAPSULE | ORAL | 0 refills | Status: DC

## 2023-03-05 NOTE — Telephone Encounter (Signed)
Prescription Request  03/05/2023  LOV: 05/05/2022  What is the name of the medication or equipment? amphetamine-dextroamphetamine (ADDERALL XR) 20 MG 24 hr capsule   Have you contacted your pharmacy to request a refill? No   Which pharmacy would you like this sent to?   [place], [place] AT [address] [place] [address] Phone: [phone] Fax: [phone]    Patient notified that their request is being sent to the clinical staff for review and that they should receive a response within 2 business days.   Please advise at Mobile [phone] (mobile)

## 2023-03-08 MED ORDER — AMPHETAMINE-DEXTROAMPHET ER 25 MG PO CP24: 25.0000 mg | ORAL_CAPSULE | ORAL | 0 refills | Status: DC

## 2023-03-08 NOTE — Telephone Encounter (Signed)
Pt is calling and pharm has 25 mg in stock not 20 mg. Please call in generic adderall 25 mg

## 2023-03-08 NOTE — Telephone Encounter (Signed)
25 mg dose sent.  Eulas Post MD [facility]

## 2023-03-08 NOTE — Addendum Note (Signed)
Addended by: Eulas Post on: 03/08/2023 01:04 PM   Modules accepted: Orders

## 2023-03-16 DIAGNOSIS — D6859 Other primary thrombophilia: Secondary | ICD-10-CM | POA: Diagnosis not present

## 2023-03-16 DIAGNOSIS — I82552 Chronic embolism and thrombosis of left peroneal vein: Secondary | ICD-10-CM | POA: Diagnosis not present

## 2023-04-03 DIAGNOSIS — I82552 Chronic embolism and thrombosis of left peroneal vein: Secondary | ICD-10-CM | POA: Diagnosis not present

## 2023-04-04 ENCOUNTER — Other Ambulatory Visit: Payer: Self-pay | Admitting: Family Medicine

## 2023-04-04 ENCOUNTER — Telehealth: Payer: Self-pay | Admitting: Family Medicine

## 2023-04-04 DIAGNOSIS — I82452 Acute embolism and thrombosis of left peroneal vein: Secondary | ICD-10-CM | POA: Diagnosis not present

## 2023-04-04 DIAGNOSIS — G43E09 Chronic migraine with aura, not intractable, without status migrainosus: Secondary | ICD-10-CM | POA: Diagnosis not present

## 2023-04-04 MED ORDER — AMPHETAMINE-DEXTROAMPHET ER 25 MG PO CP24: 25.0000 mg | ORAL_CAPSULE | ORAL | 0 refills | Status: DC

## 2023-04-04 NOTE — Telephone Encounter (Signed)
Prescription Request  04/04/2023  LOV: 05/05/2022  What is the name of the medication or equipment?  amphetamine-dextroamphetamine (ADDERALL XR) 25 MG 24 hr capsule   Pt informed MD is OOO until June 2024.  Have you contacted your pharmacy to request a refill? No   Which pharmacy would you like this sent to?   [facility] #[id number] [address], [place] - [address] AT [facility] [address] & [facility] [address] Phone: [phone]  Fax: [phone]       Patient notified that their request is being sent to the clinical staff for review and that they should receive a response within 2 business days.   Please advise at Mobile [phone] (mobile)

## 2023-04-04 NOTE — Telephone Encounter (Signed)
Duplicate: Please Disregard 

## 2023-05-08 ENCOUNTER — Other Ambulatory Visit: Payer: Self-pay | Admitting: Family Medicine

## 2023-05-08 MED ORDER — AMPHETAMINE-DEXTROAMPHET ER 25 MG PO CP24: 25.0000 mg | ORAL_CAPSULE | ORAL | 0 refills | Status: DC

## 2023-05-08 NOTE — Telephone Encounter (Signed)
Prescription Request  05/08/2023  LOV: Visit date not found  What is the name of the medication or equipment? amphetamine-dextroamphetamine (ADDERALL XR) 25 MG 24 hr capsule   Have you contacted your pharmacy to request a refill? No   Which pharmacy would you like this sent to?   [facility] #[id number] [place], [place] - [address] AT [facility] [address] [facility] [address] & [facility] [address] [place] [address] Phone: [phone] Fax: [phone]    Patient notified that their request is being sent to the clinical staff for review and that they should receive a response within 2 business days.   Please advise at Mobile [phone] (mobile)

## 2023-05-16 DIAGNOSIS — C73 Malignant neoplasm of thyroid gland: Secondary | ICD-10-CM | POA: Diagnosis not present

## 2023-06-05 ENCOUNTER — Other Ambulatory Visit: Payer: Self-pay | Admitting: Family Medicine

## 2023-06-05 NOTE — Telephone Encounter (Signed)
Prescription Request  06/05/2023  LOV: Visit date not found. Pt has an appt sch for 07-03-2023  What is the name of the medication or equipment? amphetamine-dextroamphetamine (ADDERALL XR) 25 MG 24 hr capsule   Have you contacted your pharmacy to request a refill? No   Which pharmacy would you like this sent to?   [facility] DR[facility] #[id number] Ginette Otto, [place] - [address] AT [facility] OF [facility] [address] & [facility] [address] [place] [address] Phone: [phone] Fax: [phone]    Patient notified that their request is being sent to the clinical staff for review and that they should receive a response within 2 business days.   Please advise at Mobile [phone] (mobile)

## 2023-06-06 MED ORDER — AMPHETAMINE-DEXTROAMPHET ER 25 MG PO CP24: 25.0000 mg | ORAL_CAPSULE | ORAL | 0 refills | Status: DC

## 2023-06-06 NOTE — Telephone Encounter (Signed)
Last office visit was 05/05/22.  Patient has an appointment scheduled 07/03/23.

## 2023-07-03 ENCOUNTER — Ambulatory Visit (INDEPENDENT_AMBULATORY_CARE_PROVIDER_SITE_OTHER): Payer: BC Managed Care – PPO | Admitting: Family Medicine

## 2023-07-03 ENCOUNTER — Encounter: Payer: Self-pay | Admitting: Family Medicine

## 2023-07-03 VITALS — BP 140/76 | HR 81 | Temp 97.7°F | Ht 69.0 in | Wt 212.6 lb

## 2023-07-03 DIAGNOSIS — F900 Attention-deficit hyperactivity disorder, predominantly inattentive type: Secondary | ICD-10-CM

## 2023-07-03 MED ORDER — AMPHETAMINE-DEXTROAMPHET ER 25 MG PO CP24: 25.0000 mg | ORAL_CAPSULE | ORAL | 0 refills | Status: DC

## 2023-07-03 NOTE — Progress Notes (Signed)
Established Patient Office Visit  Subjective   Patient ID: Jennifer Shepard, female    DOB: 1966-07-27  Age: 57 y.o. MRN: [id number]  Chief Complaint  Patient presents with   Medication Consultation    HPI   Jennifer Shepard is here for med check.  She has history of hypertension, migraine headaches, hypothyroidism, ADD, history of thyroid cancer.  She is followed by endocrinologist regarding her thyroid.  She does relate for an injury earlier this year and was in a boot and also had flown and developed subsequent DVT.  She is on Eliquis at this time and is followed elsewhere regarding her DVT.  She does have history of hypertension which is currently treated with verapamil and Toprol-XL.  Her blood pressures have generally been stable.  Did not take her blood pressure medication earlier today.  She is followed by cardiologist with [facility] and also followed by GYN.  ADD treated with Adderall XR 25 mg once daily.  This seems to working well for her.  Needs refills.  Denies any adverse side effects.  Past Medical History:  Diagnosis Date   Benign essential HTN    Cancer [facility])    Thyroid   Past Surgical History:  Procedure Laterality Date   APPENDECTOMY     BREAST CYST ASPIRATION Right pt unsure   no visible scar   CESAREAN SECTION  01/2004   CESAREAN SECTION  10/2005   INTRAUTERINE DEVICE INSERTION  06/26/2015   Mirena   LASIK     THYROIDECTOMY  07/2009   FOR THYROID CANCER; Jennifer Shepard    reports that she has never smoked. She has never used smokeless tobacco. She reports current alcohol use. She reports that she does not use drugs. family history includes CAD in her maternal grandmother; Hyperlipidemia in her father; Hypertension in her maternal grandmother and mother. Allergies  Allergen Reactions   Cefaclor Other (See Comments)    Angioedema of lips   Morphine And Codeine Other (See Comments)    Severe headache   Tyloxapol    Oxycodone-Acetaminophen Itching    itching     Review of Systems  Constitutional:  Negative for malaise/fatigue.  Eyes:  Negative for blurred vision.  Respiratory:  Negative for shortness of breath.   Cardiovascular:  Negative for chest pain.  Neurological:  Negative for dizziness, weakness and headaches.      Objective:     BP (!) 140/76 (BP Location: Left Arm, Patient Position: Sitting, Cuff Size: Normal)   Pulse 81   Temp 97.7 F (36.5 C) (Oral)   Ht 5\' 9"  (1.753 m)   Wt 212 lb 9.6 oz (96.4 kg)   LMP  (LMP Unknown)   SpO2 99%   BMI 31.40 kg/m  BP Readings from Last 3 Encounters:  07/03/23 (!) 140/76  10/10/22 (!) 158/98  09/26/22 124/82   Wt Readings from Last 3 Encounters:  07/03/23 212 lb 9.6 oz (96.4 kg)  10/10/22 213 lb (96.6 kg)  09/26/22 210 lb (95.3 kg)      Physical Exam Vitals reviewed.  Constitutional:      Appearance: She is well-developed.  Eyes:     Pupils: Pupils are equal, round, and reactive to light.  Neck:     Thyroid: No thyromegaly.     Vascular: No JVD.  Cardiovascular:     Rate and Rhythm: Normal rate and regular rhythm.     Heart sounds:     No gallop.  Pulmonary:     Effort:  Pulmonary effort is normal. No respiratory distress.     Breath sounds: Normal breath sounds. No wheezing or rales.  Musculoskeletal:     Cervical back: Neck supple.     Right lower leg: No edema.     Left lower leg: No edema.  Neurological:     Mental Status: She is alert.      No results found for any visits on 07/03/23.  Last CBC Lab Results  Component Value Date   WBC 6.0 05/05/2022   HGB 13.0 05/05/2022   HCT 38.0 05/05/2022   MCV 93.3 05/05/2022   MCH 32.6 02/14/2021   RDW 13.6 05/05/2022   PLT 268.0 05/05/2022   Last metabolic panel Lab Results  Component Value Date   GLUCOSE 103 (H) 05/08/2022   NA 139 05/08/2022   K 3.9 05/08/2022   CL 104 05/08/2022   CO2 26 05/08/2022   BUN 16 05/08/2022   CREATININE 0.98 05/08/2022   GFR 65.37 05/08/2022   CALCIUM 9.7 05/08/2022    PROT 7.2 05/05/2022   ALBUMIN 4.3 05/05/2022   BILITOT 0.6 05/05/2022   ALKPHOS 83 05/05/2022   AST 15 05/05/2022   ALT 15 05/05/2022   ANIONGAP 10 05/29/2017   Last thyroid functions Lab Results  Component Value Date   TSH 1.38 05/05/2022      The 10-year ASCVD risk score (Arnett DK, et al., 2019) is: 2.1%    Assessment & Plan:   Adult ADD.  Patient stable on Adderall XR 25 mg once daily.  Refill #90.  We have strongly urged her to monitor blood pressure closely and be in touch for any consistent readings over 140 systolic or 90 diastolic.  Jennifer Peat, MD

## 2023-09-04 ENCOUNTER — Telehealth: Payer: Self-pay | Admitting: Family Medicine

## 2023-09-04 MED ORDER — AMPHETAMINE-DEXTROAMPHET ER 25 MG PO CP24: 25.0000 mg | ORAL_CAPSULE | ORAL | 0 refills | Status: DC

## 2023-09-04 NOTE — Telephone Encounter (Signed)
Refill sent   Eulas Post MD [facility]

## 2023-09-04 NOTE — Telephone Encounter (Signed)
Pt only received #60 instead of #90 due to pharm did not have in stock and pt need new rx for amphetamine-dextroamphetamine (ADDERALL XR) 25 MG 24 hr capsule [facility] DRUG STORE #[id number] Ginette Otto, [place] - [address] AT [facility] OF [address] & [facility] [address] Phone: [phone]  Fax: [phone]

## 2023-09-05 NOTE — Telephone Encounter (Signed)
Noted  

## 2023-12-06 ENCOUNTER — Telehealth: Payer: Self-pay | Admitting: Family Medicine

## 2023-12-06 NOTE — Telephone Encounter (Signed)
Prescription Request  12/06/2023  LOV: 07/03/2023  What is the name of the medication or equipment? amphetamine-dextroamphetamine (ADDERALL XR) 25 MG 24 hr capsule #90  Have you contacted your pharmacy to request a refill? No   Which pharmacy would you like this sent to?  [facility] DRUG STORE #[id number] Ginette Otto, [place] - [address] AT [facility] OF [facility] [address] & [facility] [address] [place] [address] Phone: [phone] Fax: [phone]    Patient notified that their request is being sent to the clinical staff for review and that they should receive a response within 2 business days.   Please advise at Mobile [phone] (mobile)

## 2023-12-07 MED ORDER — AMPHETAMINE-DEXTROAMPHET ER 25 MG PO CP24: 25.0000 mg | ORAL_CAPSULE | ORAL | 0 refills | Status: DC

## 2024-01-23 ENCOUNTER — Other Ambulatory Visit: Payer: Self-pay | Admitting: Family Medicine

## 2024-01-23 DIAGNOSIS — Z1231 Encounter for screening mammogram for malignant neoplasm of breast: Secondary | ICD-10-CM

## 2024-02-13 ENCOUNTER — Ambulatory Visit
Admission: RE | Admit: 2024-02-13 | Discharge: 2024-02-13 | Disposition: A | Discharge status: Home / Self Care | Payer: BC Managed Care – PPO | Source: Ambulatory Visit

## 2024-02-13 DIAGNOSIS — Z1231 Encounter for screening mammogram for malignant neoplasm of breast: Secondary | ICD-10-CM | POA: Diagnosis not present

## 2024-03-06 ENCOUNTER — Ambulatory Visit: Payer: BC Managed Care – PPO | Admitting: Nurse Practitioner

## 2024-03-06 ENCOUNTER — Ambulatory Visit (INDEPENDENT_AMBULATORY_CARE_PROVIDER_SITE_OTHER): Admitting: Nurse Practitioner

## 2024-03-06 ENCOUNTER — Other Ambulatory Visit: Payer: Self-pay | Admitting: Family Medicine

## 2024-03-06 ENCOUNTER — Other Ambulatory Visit (HOSPITAL_COMMUNITY)
Admission: RE | Admit: 2024-03-06 | Discharge: 2024-03-06 | Disposition: A | Discharge status: Home / Self Care | Source: Ambulatory Visit | Attending: Nurse Practitioner | Admitting: Nurse Practitioner

## 2024-03-06 ENCOUNTER — Encounter: Payer: Self-pay | Admitting: Nurse Practitioner

## 2024-03-06 VITALS — BP 132/84 | HR 103 | Ht 68.5 in | Wt 216.0 lb

## 2024-03-06 DIAGNOSIS — Z780 Asymptomatic menopausal state: Secondary | ICD-10-CM | POA: Diagnosis not present

## 2024-03-06 DIAGNOSIS — Z124 Encounter for screening for malignant neoplasm of cervix: Secondary | ICD-10-CM | POA: Insufficient documentation

## 2024-03-06 DIAGNOSIS — Z01419 Encounter for gynecological examination (general) (routine) without abnormal findings: Secondary | ICD-10-CM

## 2024-03-06 DIAGNOSIS — E785 Hyperlipidemia, unspecified: Secondary | ICD-10-CM | POA: Diagnosis not present

## 2024-03-06 NOTE — Progress Notes (Signed)
   Jennifer Shepard 07/24/1966 [id number]   History:  58 y.o. [id number] presents for annual exam without GYN complaints. Postmenopausal - no HRT. Normal pap history. H/O thyroid cancer, managed by endocrinology. H/O migraine with aura, DVT (01/2023 after breaking foot), on Eliquis, HTN. Seeing cardiology.   Gynecologic History No LMP recorded (lmp unknown). Patient is postmenopausal.   Contraception/Family planning: post menopausal status Sexually active: No  Health Maintenance Last Pap: 11/25/2018. Results were: Normal neg HPV Last mammogram: 02/13/2024. Results were: Normal Last colonoscopy: 12/02/2021. Results: Normal, 10-year recall Last Dexa: Not indicated  Past medical history, past surgical history, family history and social history were all reviewed and documented in the EPIC chart. 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 14 yo daughter at [place], 18 yo son.   ROS:  A ROS was performed and pertinent positives and negatives are included.  Exam:  Vitals:   03/06/24 1011  BP: 132/84  Pulse: (!) 103  SpO2: 97%  Weight: 216 lb (98 kg)  Height: 5' 8.5" (1.74 m)     Body mass index is 32.36 kg/m.  General appearance:  Normal Thyroid:  Symmetrical, normal in size, without palpable masses or nodularity. Respiratory  Auscultation:  Clear without wheezing or rhonchi Cardiovascular  Auscultation:  Regular rate, without rubs, murmurs or gallops  Edema/varicosities:  Not grossly evident Abdominal  Soft,nontender, without masses, guarding or rebound.  Liver/spleen:  No organomegaly noted  Hernia:  None appreciated  Skin  Inspection:  Grossly normal   Breasts: Examined lying and sitting.   Right: Without masses, retractions, discharge or axillary adenopathy.   Left: Without masses, retractions, discharge or axillary adenopathy. Pelvic: External genitalia:  no lesions              Urethra:  normal appearing urethra with no masses, tenderness or lesions              Bartholins and Skenes: normal                  Vagina: normal appearing vagina with normal color and discharge, no lesions              Cervix: no lesions Bimanual Exam:  Uterus:  no masses or tenderness              Adnexa: no mass, fullness, tenderness              Rectovaginal: Deferred              Anus:  normal, no lesions  Patient informed chaperone available to be present for breast and pelvic exam. Patient has requested no chaperone to be present. Patient has been advised what will be completed during breast and pelvic exam.   Assessment/Plan:  58 y.o. [id number] for annual exam.   Well female exam with routine gynecological exam - Education provided on SBEs, importance of preventative screenings, current guidelines, high calcium diet, regular exercise, and multivitamin daily. Requesting preventative labs today.   Postmenopausal - no HRT, no bleeding  Cervical cancer screening - Plan: Cytology - PAP( ). Normal pap history.   Hyperlipidemia, unspecified hyperlipidemia type - Plan: Lipid panel  Screening for breast cancer - Normal mammogram history.  Continue annual screenings. Normal breast exam today.   Screening for colon cancer - Colonoscopy 11/2021. Will repeat at GI's recommended interval.   Return in about 1 year (around 03/06/2025) for Annual.    Olivia Mackie [facility], 11:03 AM 03/06/2024

## 2024-03-07 ENCOUNTER — Encounter: Payer: Self-pay | Admitting: Family Medicine

## 2024-03-07 LAB — LIPID PANEL
Cholesterol: 232 mg/dL — ABNORMAL HIGH (ref <200)
HDL: 75 mg/dL (ref >=50)
LDL Cholesterol (Calc): 143 mg/dL — ABNORMAL HIGH
Non-HDL Cholesterol (Calc): 157 mg/dL — ABNORMAL HIGH (ref <130)
Total CHOL/HDL Ratio: 3.1 (calc) (ref <5.0)
Triglycerides: 51 mg/dL (ref <150)

## 2024-03-07 LAB — CBC WITH DIFFERENTIAL/PLATELET
Absolute Lymphocytes: 1656 {cells}/uL (ref 850–3900)
Absolute Monocytes: 365 {cells}/uL (ref 200–950)
Basophils Absolute: 53 {cells}/uL (ref 0–200)
Basophils Relative: 1.1 %
Eosinophils Absolute: 53 {cells}/uL (ref 15–500)
Eosinophils Relative: 1.1 %
HCT: 41.1 % (ref 35.0–45.0)
Hemoglobin: 13.4 g/dL (ref 11.7–15.5)
MCH: 30.5 pg (ref 27.0–33.0)
MCHC: 32.6 g/dL (ref 32.0–36.0)
MCV: 93.6 fL (ref 80.0–100.0)
MPV: 10.9 fL (ref 7.5–12.5)
Monocytes Relative: 7.6 %
Neutro Abs: 2674 {cells}/uL (ref 1500–7800)
Neutrophils Relative %: 55.7 %
Platelets: 315 10*3/uL (ref 140–400)
RBC: 4.39 10*6/uL (ref 3.80–5.10)
RDW: 11.8 % (ref 11.0–15.0)
Total Lymphocyte: 34.5 %
WBC: 4.8 10*3/uL (ref 3.8–10.8)

## 2024-03-07 LAB — CYTOLOGY - PAP
Adequacy: ABSENT
Comment: NEGATIVE
Diagnosis: NEGATIVE
High risk HPV: NEGATIVE

## 2024-03-07 LAB — COMPREHENSIVE METABOLIC PANEL
AG Ratio: 1.7 (calc) (ref 1.0–2.5)
ALT: 21 U/L (ref 6–29)
AST: 18 U/L (ref 10–35)
Albumin: 4.3 g/dL (ref 3.6–5.1)
Alkaline phosphatase (APISO): 73 U/L (ref 37–153)
BUN: 9 mg/dL (ref 7–25)
CO2: 31 mmol/L (ref 20–32)
Calcium: 9.8 mg/dL (ref 8.6–10.4)
Chloride: 102 mmol/L (ref 98–110)
Creat: 0.69 mg/dL (ref 0.50–1.03)
Globulin: 2.5 g/dL (ref 1.9–3.7)
Glucose, Bld: 102 mg/dL — ABNORMAL HIGH (ref 65–99)
Potassium: 4.9 mmol/L (ref 3.5–5.3)
Sodium: 138 mmol/L (ref 135–146)
Total Bilirubin: 0.6 mg/dL (ref 0.2–1.2)
Total Protein: 6.8 g/dL (ref 6.1–8.1)

## 2024-03-07 MED ORDER — AMPHETAMINE-DEXTROAMPHET ER 25 MG PO CP24: 25.0000 mg | ORAL_CAPSULE | ORAL | 0 refills | Status: DC

## 2024-03-10 ENCOUNTER — Encounter: Payer: Self-pay | Admitting: Nurse Practitioner

## 2024-04-09 DIAGNOSIS — Z7901 Long term (current) use of anticoagulants: Secondary | ICD-10-CM | POA: Diagnosis not present

## 2024-04-09 DIAGNOSIS — I82452 Acute embolism and thrombosis of left peroneal vein: Secondary | ICD-10-CM | POA: Diagnosis not present

## 2024-04-09 DIAGNOSIS — R030 Elevated blood-pressure reading, without diagnosis of hypertension: Secondary | ICD-10-CM | POA: Diagnosis not present

## 2024-04-09 DIAGNOSIS — D6859 Other primary thrombophilia: Secondary | ICD-10-CM | POA: Diagnosis not present

## 2024-05-28 DIAGNOSIS — C73 Malignant neoplasm of thyroid gland: Secondary | ICD-10-CM | POA: Diagnosis not present

## 2024-05-28 DIAGNOSIS — H532 Diplopia: Secondary | ICD-10-CM | POA: Diagnosis not present

## 2024-06-09 ENCOUNTER — Other Ambulatory Visit: Payer: Self-pay | Admitting: Family Medicine

## 2024-06-09 MED ORDER — AMPHETAMINE-DEXTROAMPHET ER 25 MG PO CP24: 25.0000 mg | ORAL_CAPSULE | ORAL | 0 refills | Status: DC

## 2024-06-09 NOTE — Telephone Encounter (Signed)
 Copied from CRM [id number]. Topic: Clinical - Medication Refill >> Jun 09, 2024 12:26 PM Freya Jesus wrote: Medication: amphetamine -dextroamphetamine (ADDERALL XR) 25 MG 24 hr capsule [[id number]]  Has the patient contacted their pharmacy? No (Agent: If no, request that the patient contact the pharmacy for the refill. If patient does not wish to contact the pharmacy document the reason why and proceed with request.) (Agent: If yes, when and what did the pharmacy advise?)  This is the patient's preferred pharmacy:  [facility] DRUG STORE #[id number] Jonette Nestle, [place] - [address] AT [facility] OF [facility] [address] & [facility] [address] [place] [address] Phone: [phone] Fax: [phone]  Is this the correct pharmacy for this prescription? Yes If no, delete pharmacy and type the correct one.   Has the prescription been filled recently? Yes  Is the patient out of the medication? Yes  Has the patient been seen for an appointment in the last year OR does the patient have an upcoming appointment? Yes  Can we respond through MyChart? Yes  Agent: Please be advised that Rx refills may take up to 3 business days. We ask that you follow-up with your pharmacy.

## 2024-06-09 NOTE — Telephone Encounter (Signed)
 Medication refilled for 1 month.  Does need office follow-up by July--- this makes 1 year from last office follow-up  Marquetta Sit MD [facility]

## 2024-06-13 ENCOUNTER — Encounter: Payer: Self-pay | Admitting: Family Medicine

## 2024-06-13 ENCOUNTER — Ambulatory Visit: Admitting: Family Medicine

## 2024-06-13 VITALS — BP 128/86 | HR 84 | Temp 97.7°F | Wt 218.0 lb

## 2024-06-13 DIAGNOSIS — R635 Abnormal weight gain: Secondary | ICD-10-CM | POA: Diagnosis not present

## 2024-06-13 DIAGNOSIS — F909 Attention-deficit hyperactivity disorder, unspecified type: Secondary | ICD-10-CM | POA: Diagnosis not present

## 2024-06-13 DIAGNOSIS — E785 Hyperlipidemia, unspecified: Secondary | ICD-10-CM | POA: Diagnosis not present

## 2024-06-13 DIAGNOSIS — R739 Hyperglycemia, unspecified: Secondary | ICD-10-CM | POA: Diagnosis not present

## 2024-06-13 LAB — POCT GLYCOSYLATED HEMOGLOBIN (HGB A1C): Hemoglobin A1C: 5.1 % (ref 4.0–5.6)

## 2024-06-13 NOTE — Progress Notes (Signed)
 Established Patient Office Visit  Subjective   Patient ID: Jennifer Shepard, female    DOB: 1966-05-26  Age: 58 y.o. MRN: [id number]  Chief Complaint  Patient presents with   Medical Management of Chronic Issues    HPI   Jennifer Shepard is here for medical follow-up.  Jennifer Shepard has past medical history significant for migraine headaches, hypertension, hypothyroidism, ADD.  Jennifer Shepard also has history of thyroid  cancer  Recently saw gynecologist.  Jennifer Shepard had total cholesterol 232, HDL 75, LDL 143.  Grandmother with some sort of coronary history but Jennifer Shepard is not sure if this was premature CAD.  Jennifer Shepard may have had more of an arrhythmia issue.  Jennifer Shepard denies any recent chest pains.  Jennifer Shepard would like to further risk stratify whether Jennifer Shepard has any significant coronary issues.  Jennifer Shepard is non-smoker.  Currently not exercising regularly.  Had recent fasting blood sugar 103 and her gynecologist had suggested A1c.  Jennifer Shepard remains on metoprolol  and verapamil for hypertension.  Takes Eliquis twice daily.  Followed by endocrinologist for her thyroid  and takes Armour Thyroid .  Her ADD is treated with Adderall XR 25 mg daily  Jennifer Shepard is frustrated with difficulties trying to lose weight.  Currently not following lower glycemic or lower calorie diet.  Not consistently exercising.  Past Medical History:  Diagnosis Date   Benign essential HTN    Cancer (HCC)    Thyroid    Past Surgical History:  Procedure Laterality Date   APPENDECTOMY     BREAST CYST ASPIRATION Right pt unsure   no visible scar   CESAREAN SECTION  01/2004   CESAREAN SECTION  10/2005   INTRAUTERINE DEVICE INSERTION  06/26/2015   Mirena    LASIK     THYROIDECTOMY  07/2009   FOR THYROID  CANCER; Dr Carlette Cheers    reports that Jennifer Shepard has never smoked. Jennifer Shepard has never used smokeless tobacco. Jennifer Shepard reports current alcohol use. Jennifer Shepard reports that Jennifer Shepard does not use drugs. family history includes CAD in her maternal grandmother; Hyperlipidemia in her father; Hypertension in her maternal  grandmother and mother; Pancreatic cancer in her mother. Allergies  Allergen Reactions   Cefaclor Other (See Comments)    Angioedema of lips   Morphine And Codeine Other (See Comments)    Severe headache   Tyloxapol    Oxycodone-Acetaminophen  Itching    itching    Review of Systems  Constitutional:  Negative for malaise/fatigue.  Eyes:  Negative for blurred vision.  Respiratory:  Negative for shortness of breath.   Cardiovascular:  Negative for chest pain.  Neurological:  Negative for dizziness, weakness and headaches.      Objective:     BP 128/86 (BP Location: Left Arm, Patient Position: Sitting, Cuff Size: Normal)   Pulse 84   Temp 97.7 F (36.5 C) (Oral)   Wt 218 lb (98.9 kg)   LMP  (LMP Unknown)   SpO2 96%   BMI 32.66 kg/m  BP Readings from Last 3 Encounters:  06/13/24 128/86  03/06/24 132/84  07/04/23 (!) 140/76   Wt Readings from Last 3 Encounters:  06/13/24 218 lb (98.9 kg)  03/06/24 216 lb (98 kg)  07/03/23 212 lb 9.6 oz (96.4 kg)      Physical Exam Vitals reviewed.  Constitutional:      Appearance: Jennifer Shepard is well-developed.   Eyes:     Pupils: Pupils are equal, round, and reactive to light.   Neck:     Thyroid : No thyromegaly.     Vascular: No JVD.  Cardiovascular:     Rate and Rhythm: Normal rate and regular rhythm.     Heart sounds:     No gallop.  Pulmonary:     Effort: Pulmonary effort is normal. No respiratory distress.     Breath sounds: Normal breath sounds. No wheezing or rales.   Musculoskeletal:     Cervical back: Neck supple.   Neurological:     Mental Status: Jennifer Shepard is alert.      No results found for any visits on 06/13/24.  Last CBC Lab Results  Component Value Date   WBC 4.8 03/06/2024   HGB 13.4 03/06/2024   HCT 41.1 03/06/2024   MCV 93.6 03/06/2024   MCH 30.5 03/06/2024   RDW 11.8 03/06/2024   PLT 315 03/06/2024   Last metabolic panel Lab Results  Component Value Date   GLUCOSE 102 (H) 03/06/2024   NA 138  03/06/2024   K 4.9 03/06/2024   CL 102 03/06/2024   CO2 31 03/06/2024   BUN 9 03/06/2024   CREATININE 0.69 03/06/2024   GFR 65.37 05/08/2022   CALCIUM 9.8 03/06/2024   PROT 6.8 03/06/2024   ALBUMIN 4.3 05/05/2022   BILITOT 0.6 03/06/2024   ALKPHOS 83 05/05/2022   AST 18 03/06/2024   ALT 21 03/06/2024   ANIONGAP 10 05/29/2017   Last lipids Lab Results  Component Value Date   CHOL 232 (H) 03/06/2024   HDL 75 03/06/2024   LDLCALC 143 (H) 03/06/2024   TRIG 51 03/06/2024   CHOLHDL 3.1 03/06/2024   Last thyroid  functions Lab Results  Component Value Date   TSH 1.38 05/05/2022      The 10-year ASCVD risk score (Arnett DK, et al., 2019) is: 2.6%    Assessment & Plan:   #1 ADD stable on Adderall XR.  Continue current regimen.  Blood pressure currently stable.  #2 hypertension stable on metoprolol  and verapamil.  Did discuss importance of weight loss and watching sodium intake.  #3 hyperlipidemia.  Patient interested in further risk stratification for CAD.  We discussed coronary calcium score.  This will be ordered.  Could also consider LP(a) in the future  #4 mild hyperglycemia by recent fasting labs through GYN.  Check A1c today.  A1c came back normal at 5.1%.  Reassurance provided.   No follow-ups on file.    Glean Lamy, MD

## 2024-07-17 ENCOUNTER — Ambulatory Visit (HOSPITAL_BASED_OUTPATIENT_CLINIC_OR_DEPARTMENT_OTHER)
Admission: RE | Admit: 2024-07-17 | Discharge: 2024-07-17 | Disposition: A | Discharge status: Home / Self Care | Payer: Self-pay | Source: Ambulatory Visit | Attending: Family Medicine | Admitting: Family Medicine

## 2024-07-17 DIAGNOSIS — E785 Hyperlipidemia, unspecified: Secondary | ICD-10-CM | POA: Insufficient documentation

## 2024-07-20 ENCOUNTER — Ambulatory Visit: Payer: Self-pay | Admitting: Family Medicine

## 2024-08-20 ENCOUNTER — Ambulatory Visit: Admitting: Family Medicine

## 2024-08-20 ENCOUNTER — Ambulatory Visit: Payer: Self-pay

## 2024-08-20 ENCOUNTER — Encounter: Payer: Self-pay | Admitting: Family Medicine

## 2024-08-20 ENCOUNTER — Ambulatory Visit (INDEPENDENT_AMBULATORY_CARE_PROVIDER_SITE_OTHER): Admitting: Family Medicine

## 2024-08-20 VITALS — BP 102/60 | HR 117 | Temp 97.8°F | Ht 68.5 in | Wt 211.0 lb

## 2024-08-20 DIAGNOSIS — R6883 Chills (without fever): Secondary | ICD-10-CM

## 2024-08-20 DIAGNOSIS — R509 Fever, unspecified: Secondary | ICD-10-CM

## 2024-08-20 DIAGNOSIS — R197 Diarrhea, unspecified: Secondary | ICD-10-CM | POA: Diagnosis not present

## 2024-08-20 DIAGNOSIS — A084 Viral intestinal infection, unspecified: Secondary | ICD-10-CM

## 2024-08-20 DIAGNOSIS — R000 Tachycardia, unspecified: Secondary | ICD-10-CM

## 2024-08-20 DIAGNOSIS — E861 Hypovolemia: Secondary | ICD-10-CM

## 2024-08-20 DIAGNOSIS — R52 Pain, unspecified: Secondary | ICD-10-CM | POA: Diagnosis not present

## 2024-08-20 LAB — POCT INFLUENZA A/B
Influenza A, POC: NEGATIVE
Influenza B, POC: NEGATIVE

## 2024-08-20 LAB — POC COVID19 BINAXNOW: SARS Coronavirus 2 Ag: NEGATIVE

## 2024-08-20 NOTE — Progress Notes (Addendum)
 Established Patient Office Visit   Subjective  Patient ID: Jennifer Shepard, female    DOB: 1966-12-11  Age: 58 y.o. MRN: [id number]  Chief Complaint  Patient presents with   Fever    Patient complains of a temperature of 102-101.2 x3 days, tried Tylenol  and Ibuprofen and states the home Covid test was negative   Generalized Body Aches    X3 days   Diarrhea    X2 days    [name] followed by Dr. Micheal and seen for acute concern.  Pt with HA, fever Tmax 102.0 F, chills, body aches, and diarrhea x 3 days.  Denies cough, ST, n/v, facial pain/pressure, ear pain/pressure, dysuria, rhinorrhea, recent abx use, eating at a restaurant or cookout.  Taking ibuprofen and tylenol .     Patient Active Problem List   Diagnosis Date Noted   Benign essential HTN    Hypothyroidism 09/05/2016   ADD (attention deficit disorder) 07/28/2016   Right-sided nosebleed 06/02/2016   IUD (intrauterine device) in place 08/03/2015   Elevated blood pressure reading without diagnosis of hypertension 05/20/2014   History of thyroid  cancer 03/13/2014   Palpitations 08/11/2013   Abdominal bruit 05/15/2013   Migraine headache    HSV (herpes simplex virus) infection    Condyloma acuminatum    Past Medical History:  Diagnosis Date   Benign essential HTN    Cancer (HCC)    Thyroid    Past Surgical History:  Procedure Laterality Date   APPENDECTOMY     BREAST CYST ASPIRATION Right pt unsure   no visible scar   CESAREAN SECTION  01/2004   CESAREAN SECTION  10/2005   INTRAUTERINE DEVICE INSERTION  06/26/2015   Mirena    LASIK     THYROIDECTOMY  07/2009   FOR THYROID  CANCER; Dr Graig   Social History   Tobacco Use   Smoking status: Never   Smokeless tobacco: Never  Vaping Use   Vaping status: Never Used  Substance Use Topics   Alcohol use: Yes    Comment: SOCIALLY ONLY   Drug use: No   Family History  Problem Relation Age of Onset   Hypertension Mother    Pancreatic cancer Mother     Hyperlipidemia Father    Hypertension Maternal Grandmother    CAD Maternal Grandmother        CBAG in 52s; pacer   Cancer Neg Hx    Diabetes Neg Hx    Stroke Neg Hx    Breast cancer Neg Hx    Allergies  Allergen Reactions   Cefaclor Other (See Comments)    Angioedema of lips   Morphine And Codeine Other (See Comments)    Severe headache   Tyloxapol    Oxycodone-Acetaminophen  Itching    itching    ROS Negative unless stated above    Objective:     BP 102/60   Pulse (!) 117   Temp 97.8 F (36.6 C) (Oral)   Ht 5' 8.5 (1.74 m)   Wt 211 lb (95.7 kg)   LMP  (LMP Unknown)   SpO2 98%   BMI 31.62 kg/m  BP Readings from Last 3 Encounters:  08/20/24 102/60  06/13/24 128/86  03/06/24 132/84   Wt Readings from Last 3 Encounters:  08/20/24 211 lb (95.7 kg)  06/13/24 218 lb (98.9 kg)  03/06/24 216 lb (98 kg)      Physical Exam Constitutional:      Appearance: Normal appearance. She is ill-appearing.  HENT:  Head: Normocephalic and atraumatic.     Nose: Nose normal.     Mouth/Throat:     Mouth: Mucous membranes are moist.  Cardiovascular:     Rate and Rhythm: Regular rhythm.     Heart sounds: Normal heart sounds. No murmur heard.    No gallop.  Pulmonary:     Effort: Pulmonary effort is normal. No respiratory distress.     Breath sounds: Normal breath sounds. No wheezing, rhonchi or rales.  Skin:    General: Skin is warm and dry.  Neurological:     Mental Status: She is alert and oriented to person, place, and time.        03/06/2024   10:17 AM 07/03/2023    9:37 AM 05/05/2022   11:48 AM  Depression screen PHQ 2/9  Decreased Interest 0 0 0  Down, Depressed, Hopeless 0 0 0  PHQ - 2 Score 0 0 0       No data to display           Results for orders placed or performed in visit on 08/20/24  POC COVID-19  Result Value Ref Range   SARS Coronavirus 2 Ag Negative Negative  POC Influenza A/B  Result Value Ref Range   Influenza A, POC Negative  Negative   Influenza B, POC Negative Negative      Assessment & Plan:   Viral gastroenteritis  Fever, unspecified fever cause -     POC COVID-19 BinaxNow -     POCT Influenza A/B  Body aches -     POC COVID-19 BinaxNow -     POCT Influenza A/B  Diarrhea, unspecified type -     POC COVID-19 BinaxNow  Chills  Tachycardia  Hypotension due to hypovolemia  Acute viral gastroenteritis symptoms. POC COVID and Flu tests negative.  Discussed the importance of supportive care including hydration.  Tachycardia and hypotension likely due to dehydration.  Continue tylenol  or NSAIDs.  OTC imodium prn, ORS, etc.  For continued symptoms consider labs including TSH and stool studies.  Given strict ED precautions.  Return if symptoms worsen or fail to improve.   Clotilda JONELLE Single, MD

## 2024-08-20 NOTE — Telephone Encounter (Signed)
 FYI Only or Action Required?: FYI only for provider.  Patient was last seen in primary care on 06/13/2024 by Micheal Wolm ORN, MD.  Called Nurse Triage reporting Fever.  Symptoms began Sunday.  Interventions attempted: Nothing.  Symptoms are: gradually worsening.  Triage Disposition: See Physician Within 24 Hours  Patient/caregiver understands and will follow disposition?: Yes  Copied from CRM #[id number]. Topic: Clinical - Red Word Triage >> Aug 20, 2024  9:52 AM Charlotte C wrote: Red Word that prompted transfer to Nurse Triage: Patient reporting she is having high fever, feeling of sever pain all over, Chills. Reason for Disposition  Fever present > 3 days (72 hours)  Answer Assessment - Initial Assessment Questions 1. TEMPERATURE: What is the most recent temperature?  How was it measured?      10 1.2  2. ONSET: When did the fever start?      Sunday 3. CHILLS: Do you have chills? If yes: How bad are they?  (e.g., none, mild, moderate, severe)     Moderate to severe 4. OTHER SYMPTOMS: Do you have any other symptoms besides the fever?  (e.g., abdomen pain, cough, diarrhea, earache, headache, sore throat, urination pain)     Body aches all over, diarrhea,  5. CAUSE: If there are no symptoms, ask: What do you think is causing the fever?      Unknown 6. CONTACTS: Does anyone else in the family have an infection?     na 7. TREATMENT: What have you done so far to treat this fever? (e.g., OTC fever medicines)     na 8. IMMUNOCOMPROMISE: Do you have any of the following: diabetes, HIV positive, splenectomy, cancer chemotherapy, chronic steroid treatment, transplant patient, etc.?     na 9. PREGNANCY: Is there any chance you are pregnant? When was your last menstrual period?     na 10. TRAVEL: Have you traveled out of the country in the last month? (e.g., travel history, exposures)       na  Protocols used: [facility]

## 2024-08-20 NOTE — Patient Instructions (Addendum)
 Your covid and flu test were negative.    Continue tylenol  and ibuprofen.  Hydration is important.  You can also take over the counter imodium.  If you are unable to stay hydrated proceed to nearest ED.

## 2024-08-22 ENCOUNTER — Telehealth: Payer: Self-pay

## 2024-08-22 MED ORDER — DIPHENOXYLATE-ATROPINE 2.5-0.025 MG PO TABS: 1.0000 | ORAL_TABLET | Freq: Four times a day (QID) | ORAL | 0 refills | Status: DC | PRN

## 2024-08-22 NOTE — Telephone Encounter (Signed)
 I sent in some limited Lomotil  to try for the diarrhea.  Follow up next week if not improving.   Wolm LELON Scarlet MD [facility]

## 2024-08-22 NOTE — Telephone Encounter (Signed)
 Patient informed of the message below and voiced understanding

## 2024-08-22 NOTE — Telephone Encounter (Signed)
 Copied from CRM #[id number]. Topic: Clinical - Medication Question >> Aug 22, 2024  3:19 PM Mercedes MATSU wrote: Reason for CRM: Patient called in requesting Dr. Deanie send her some medication to the pharmacy on file for er diarrhea. She was seen in office on Wednesday, she said the imodium is not helping and would like something else sent to the pharmacy on file. Patient also asking for a follow up all and can be reached at [phone].

## 2024-08-22 NOTE — Addendum Note (Signed)
 Addended by: MICHEAL WOLM ORN on: 08/22/2024 03:36 PM   Modules accepted: Orders

## 2024-08-22 NOTE — Addendum Note (Signed)
 Addended by: MICHEAL WOLM ORN on: 08/22/2024 03:42 PM   Modules accepted: Orders

## 2024-08-27 ENCOUNTER — Encounter: Payer: Self-pay | Admitting: Family Medicine

## 2024-08-27 ENCOUNTER — Ambulatory Visit: Admitting: Family Medicine

## 2024-08-27 ENCOUNTER — Telehealth: Payer: Self-pay

## 2024-08-27 VITALS — BP 126/76 | HR 93 | Temp 97.7°F | Wt 210.6 lb

## 2024-08-27 DIAGNOSIS — R5383 Other fatigue: Secondary | ICD-10-CM | POA: Diagnosis not present

## 2024-08-27 DIAGNOSIS — R1084 Generalized abdominal pain: Secondary | ICD-10-CM

## 2024-08-27 DIAGNOSIS — R197 Diarrhea, unspecified: Secondary | ICD-10-CM | POA: Diagnosis not present

## 2024-08-27 DIAGNOSIS — R634 Abnormal weight loss: Secondary | ICD-10-CM | POA: Diagnosis not present

## 2024-08-27 NOTE — Progress Notes (Signed)
 Established Patient Office Visit  Subjective   Patient ID: Jennifer Shepard, female    DOB: 1966/10/03  Age: 58 y.o. MRN: [id number]  Chief Complaint  Patient presents with   Abdominal Pain   Fatigue         HPI   Jennifer Shepard is seen with abdominal pain which is diffuse.  She was seen recently with diarrhea for several days.  This was nonbloody diarrhea.  She had no associated nausea or vomiting.  She states no testing was done.  She has had severe diffuse abdominal cramps at times, decreased energy, decreased appetite.  She states she has lost about 10 pounds.  Is keeping down some fluids.  Took some Lomotil  recently which helped some-after no improvement with Imodium.  She has also had some low blood pressure and dizziness at times and increased pulse especially last week.  She is especially concerned because her mom was diagnosed with pancreatic cancer last fall.  She denies any melena.  No hematemesis.  Pain is severe at times-and predominantly epigastric but somewhat diffuse and bilateral.  Chronic problems include hypertension, history of migraine headaches, hypothyroidism, ADD, history of thyroid  cancer.  Past Medical History:  Diagnosis Date   Benign essential HTN    Cancer (HCC)    Thyroid    Past Surgical History:  Procedure Laterality Date   APPENDECTOMY     BREAST CYST ASPIRATION Right pt unsure   no visible scar   CESAREAN SECTION  01/2004   CESAREAN SECTION  10/2005   INTRAUTERINE DEVICE INSERTION  06/26/2015   Mirena    LASIK     THYROIDECTOMY  07/2009   FOR THYROID  CANCER; Dr Graig    reports that she has never smoked. She has never used smokeless tobacco. She reports current alcohol use. She reports that she does not use drugs. family history includes CAD in her maternal grandmother; Hyperlipidemia in her father; Hypertension in her maternal grandmother and mother; Pancreatic cancer in her mother. Allergies  Allergen Reactions   Cefaclor Other (See Comments)     Angioedema of lips   Morphine And Codeine Other (See Comments)    Severe headache   Tyloxapol    Oxycodone-Acetaminophen  Itching    itching    Review of Systems  Constitutional:  Positive for malaise/fatigue and weight loss. Negative for chills and fever.  Respiratory:  Negative for shortness of breath.   Cardiovascular:  Negative for chest pain.  Gastrointestinal:  Positive for abdominal pain. Negative for blood in stool, heartburn, melena, nausea and vomiting.  Genitourinary:  Negative for dysuria, flank pain and hematuria.      Objective:     BP 126/76   Pulse 93   Temp 97.7 F (36.5 C) (Oral)   Wt 210 lb 9.6 oz (95.5 kg)   LMP  (LMP Unknown)   SpO2 97%   BMI 31.56 kg/m  BP Readings from Last 3 Encounters:  08/27/24 126/76  08/20/24 102/60  06/13/24 128/86   Wt Readings from Last 3 Encounters:  08/27/24 210 lb 9.6 oz (95.5 kg)  08/20/24 211 lb (95.7 kg)  06/13/24 218 lb (98.9 kg)      Physical Exam Vitals reviewed.  Constitutional:      General: She is not in acute distress.    Appearance: She is well-developed.  Cardiovascular:     Rate and Rhythm: Normal rate and regular rhythm.  Abdominal:     Comments: Normal bowel sounds.  Abdomen soft with tenderness epigastric region predominantly.  No hepatomegaly or splenomegaly noted.  Neurological:     Mental Status: She is alert.      No results found for any visits on 08/27/24.    The 10-year ASCVD risk score (Arnett DK, et al., 2019) is: 2.5%    Assessment & Plan:   Problem List Items Addressed This Visit   None Visit Diagnoses       Abdominal pain, diffuse    -  Primary   Relevant Orders   CBC with Differential/Platelet   CMP   Lipase   CT ABDOMEN PELVIS W CONTRAST     Jennifer Shepard is seen with onset last week of abdominal pain predominantly epigastric but somewhat diffuse, diarrhea, weight loss which is unintentional, decreased appetite, extreme fatigue.  Nonacute abdomen but does have  significant tenderness to palpation epigastric region.  She is very concerned because her mom died of pancreatic cancer last fall  -Obtain lab work as above-CBC, CMP, lipase - CT abdomen pelvis with contrast  No follow-ups on file.    Wolm Scarlet, MD

## 2024-08-27 NOTE — Telephone Encounter (Signed)
 Copied from CRM [id number]. Topic: General - Other >> Aug 27, 2024  3:11 PM Burnard DEL wrote: Reason for CRM: [facility] called stating that patient needs an  authorization for CT abdomen pelvis w contrast,the order that was sent to them today,in order for patient to be called for scheduled.

## 2024-08-28 ENCOUNTER — Encounter: Payer: Self-pay | Admitting: Nurse Practitioner

## 2024-08-28 ENCOUNTER — Ambulatory Visit: Payer: Self-pay | Admitting: Family Medicine

## 2024-08-28 ENCOUNTER — Telehealth: Payer: Self-pay

## 2024-08-28 ENCOUNTER — Ambulatory Visit: Admitting: Nurse Practitioner

## 2024-08-28 VITALS — BP 122/80 | Ht 68.5 in | Wt 211.0 lb

## 2024-08-28 DIAGNOSIS — N950 Postmenopausal bleeding: Secondary | ICD-10-CM

## 2024-08-28 LAB — COMPREHENSIVE METABOLIC PANEL WITH GFR
ALT: 13 U/L (ref 0–35)
AST: 14 U/L (ref 0–37)
Albumin: 3.9 g/dL (ref 3.5–5.2)
Alkaline Phosphatase: 68 U/L (ref 39–117)
BUN: 9 mg/dL (ref 6–23)
CO2: 24 meq/L (ref 19–32)
Calcium: 9.6 mg/dL (ref 8.4–10.5)
Chloride: 107 meq/L (ref 96–112)
Creatinine, Ser: 1.15 mg/dL (ref 0.40–1.20)
GFR: 53.08 mL/min — ABNORMAL LOW (ref >60.00)
Glucose, Bld: 95 mg/dL (ref 70–99)
Potassium: 5.8 meq/L — ABNORMAL HIGH (ref 3.5–5.1)
Sodium: 142 meq/L (ref 135–145)
Total Bilirubin: 0.5 mg/dL (ref 0.2–1.2)
Total Protein: 6.6 g/dL (ref 6.0–8.3)

## 2024-08-28 LAB — CBC WITH DIFFERENTIAL/PLATELET
Basophils Absolute: 0.1 K/uL (ref 0.0–0.1)
Basophils Relative: 0.7 % (ref 0.0–3.0)
Eosinophils Absolute: 0 K/uL (ref 0.0–0.7)
Eosinophils Relative: 0.6 % (ref 0.0–5.0)
HCT: 39.9 % (ref 36.0–46.0)
Hemoglobin: 13.3 g/dL (ref 12.0–15.0)
Lymphocytes Relative: 29.2 % (ref 12.0–46.0)
Lymphs Abs: 2.3 K/uL (ref 0.7–4.0)
MCHC: 33.3 g/dL (ref 30.0–36.0)
MCV: 93.9 fl (ref 78.0–100.0)
Monocytes Absolute: 0.7 K/uL (ref 0.1–1.0)
Monocytes Relative: 8.9 % (ref 3.0–12.0)
Neutro Abs: 4.8 K/uL (ref 1.4–7.7)
Neutrophils Relative %: 60.6 % (ref 43.0–77.0)
Platelets: 479 K/uL — ABNORMAL HIGH (ref 150.0–400.0)
RBC: 4.25 Mil/uL (ref 3.87–5.11)
RDW: 13.2 % (ref 11.5–15.5)
WBC: 7.8 K/uL (ref 4.0–10.5)

## 2024-08-28 LAB — LIPASE: Lipase: 65 U/L — ABNORMAL HIGH (ref 7–60)

## 2024-08-28 NOTE — Telephone Encounter (Signed)
 Patient states she wanted to get in soon for an u/s. Per Tiffany patient can come for u/s only & we can call her with results. Her u/s appt 09-02-24.

## 2024-08-28 NOTE — Telephone Encounter (Signed)
-----   Message from Kearny V sent at 08/28/2024  3:53 PM EDT ----- Pt requesting to have u/s elsewhere

## 2024-08-28 NOTE — Telephone Encounter (Signed)
 Left message for call back.

## 2024-08-28 NOTE — Progress Notes (Signed)
   Acute Office Visit  Subjective:    Patient ID: Jennifer Shepard, female    DOB: [birth year], 58 y.o.   MRN: [id number]   HPI 58 y.o. presents today for vaginal spotting x 5 days. Had fever and diarrhea a few days prior to bleeding. Bleeding is intermittent, very light and only with wiping. She is very sure bleeding is vaginal. Denies urinary symptoms, vaginal itching, discharge or odor. Had not been sexually active prior to bleeding. Being evaluated by PCP for abdominal pain. Worried because mother was diagnosed with pancreatic cancer last year. Has CT tomorrow. On Eliquis.   No LMP recorded (lmp unknown). Patient is postmenopausal.    Review of Systems  Constitutional: Negative.   Gastrointestinal:  Positive for abdominal pain.  Genitourinary:  Positive for vaginal bleeding. Negative for dysuria, flank pain, frequency, hematuria, urgency, vaginal discharge and vaginal pain.       Objective:    Physical Exam Constitutional:      Appearance: Normal appearance.  Genitourinary:    General: Normal vulva.     Vagina: Normal.     Cervix: Normal.     Uterus: Normal.      Adnexa: Right adnexa normal and left adnexa normal.     Rectum: No external hemorrhoid or internal hemorrhoid.     BP 122/80 (BP Location: Left Arm, Patient Position: Sitting, Cuff Size: Normal)   Ht 5' 8.5 (1.74 m)   Wt 211 lb (95.7 kg)   LMP  (LMP Unknown)   BMI 31.62 kg/m  Wt Readings from Last 3 Encounters:  08/28/24 211 lb (95.7 kg)  08/27/24 210 lb 9.6 oz (95.5 kg)  08/20/24 211 lb (95.7 kg)        Patient informed chaperone available to be present for breast and pelvic exam. Patient has requested no chaperone to be present. Patient has been advised what will be completed during breast and pelvic exam.     Assessment & Plan:   Problem List Items Addressed This Visit   None Visit Diagnoses       Postmenopausal bleeding    -  Primary   Relevant Orders   US  PELVIS TRANSVAGINAL NON-OB (TV  ONLY)      Plan: No evidence of bleeding on exam today. Schedule ultrasound.    [name] DNP, 3:52 PM 08/28/2024

## 2024-08-29 ENCOUNTER — Encounter: Payer: Self-pay | Admitting: Radiology

## 2024-08-29 ENCOUNTER — Ambulatory Visit
Admission: RE | Admit: 2024-08-29 | Discharge: 2024-08-29 | Disposition: A | Discharge status: Home / Self Care | Source: Ambulatory Visit | Attending: Family Medicine | Admitting: Family Medicine

## 2024-08-29 ENCOUNTER — Telehealth: Payer: Self-pay

## 2024-08-29 DIAGNOSIS — K5730 Diverticulosis of large intestine without perforation or abscess without bleeding: Secondary | ICD-10-CM | POA: Diagnosis not present

## 2024-08-29 DIAGNOSIS — R1084 Generalized abdominal pain: Secondary | ICD-10-CM

## 2024-08-29 MED ORDER — IOPAMIDOL (ISOVUE-300) INJECTION 61%
100.0000 mL | Freq: Once | INTRAVENOUS | Status: AC | PRN
  Administered 2024-08-29: 100 mL via INTRAVENOUS

## 2024-08-29 NOTE — Telephone Encounter (Signed)
 Copied from CRM [id number]. Topic: General - Other >> Aug 29, 2024  9:57 AM Berneda FALCON wrote: Reason for CRM: Pt returning phone call to Lenis Nettleton for her lab results. Please call back at [phone]

## 2024-08-29 NOTE — Telephone Encounter (Signed)
 Please see result note

## 2024-09-01 ENCOUNTER — Other Ambulatory Visit: Payer: Self-pay

## 2024-09-01 ENCOUNTER — Telehealth: Payer: Self-pay

## 2024-09-01 DIAGNOSIS — N950 Postmenopausal bleeding: Secondary | ICD-10-CM

## 2024-09-01 NOTE — Telephone Encounter (Signed)
 Patient called & stated she has an u/s scheduled for tomorrow. She had a CT abdomen/pelvis done on Friday (ordered by pcp) that showed some thickening of endometrial lining. Patient is asking if appointment tomorrow for her u/s needs to be changed to a biopsy or different appt since she had the CT scan done. Please advise.

## 2024-09-01 NOTE — Telephone Encounter (Signed)
 CT scan reports not able to fully assess endometrium. I recommend keeping US  appt but we will also do EMB tomorrow. Please change schedule notes to reflect this.

## 2024-09-01 NOTE — Telephone Encounter (Signed)
 Left message to call back.

## 2024-09-01 NOTE — Telephone Encounter (Signed)
 I see now it is for US  only. Keep US  appt and schedule appt to discuss US  and do EMB.

## 2024-09-01 NOTE — Telephone Encounter (Signed)
 Patient notified. Message sent to scheduling department to call her to schedule the u/s consult with endo bx.

## 2024-09-01 NOTE — Telephone Encounter (Signed)
 Order placed

## 2024-09-02 ENCOUNTER — Ambulatory Visit (INDEPENDENT_AMBULATORY_CARE_PROVIDER_SITE_OTHER)

## 2024-09-02 DIAGNOSIS — N950 Postmenopausal bleeding: Secondary | ICD-10-CM | POA: Diagnosis not present

## 2024-09-02 NOTE — Telephone Encounter (Signed)
 Scheduled for 09-04-24 for u/s consult & endo bx

## 2024-09-04 ENCOUNTER — Encounter: Payer: Self-pay | Admitting: Nurse Practitioner

## 2024-09-04 ENCOUNTER — Ambulatory Visit: Admitting: Nurse Practitioner

## 2024-09-04 ENCOUNTER — Other Ambulatory Visit (HOSPITAL_COMMUNITY)
Admission: RE | Admit: 2024-09-04 | Discharge: 2024-09-04 | Disposition: A | Discharge status: Home / Self Care | Source: Ambulatory Visit | Attending: Nurse Practitioner | Admitting: Nurse Practitioner

## 2024-09-04 VITALS — BP 110/64 | HR 84

## 2024-09-04 DIAGNOSIS — N950 Postmenopausal bleeding: Secondary | ICD-10-CM | POA: Diagnosis not present

## 2024-09-04 DIAGNOSIS — R9389 Abnormal findings on diagnostic imaging of other specified body structures: Secondary | ICD-10-CM | POA: Insufficient documentation

## 2024-09-04 DIAGNOSIS — N858 Other specified noninflammatory disorders of uterus: Secondary | ICD-10-CM | POA: Diagnosis not present

## 2024-09-04 NOTE — Progress Notes (Signed)
      ENDOMETRIAL BIOPSY       Jennifer Shepard 58 y.o. presents for endometrial biopsy. Reason for biopsy: PMB, thickened endometrium 20 mm.  The indications for endometrial biopsy were reviewed.    Risks of the biopsy including cramping, bleeding, infection, uterine perforation, inadequate specimen and need for additional procedures  were discussed.  The patient states she understands and agrees to undergo procedure today. Consent obtained.  Time out was performed.   Procedure Speculum inserted into the vagina, cervix visualized and was prepped with Betadine. A single-toothed tenaculum was placed on the anterior lip of the cervix to stabilize it.  The 3 mm pipelle was introduced into the endometrial cavity without difficulty to a depth of 7cm, suction initiated and a moderate amount of tissue was obtained and sent to pathology.  The instruments were removed from the patient's vagina.  Minimal bleeding from the cervix was noted.  The patient tolerated the procedure well.   Jennifer Shepard, CMA present for exam  Assessment/Plan: Thickened endometrium - Plan: Endometrial biopsy, Surgical pathology  Postmenopausal bleeding - Plan: Endometrial biopsy, Surgical pathology   Routine post-procedure instructions were given to the patient.   Will contact with results of biopsy.    Jennifer DELENA Shutter DNP, 9:12 AM 09/04/2024

## 2024-09-05 LAB — SURGICAL PATHOLOGY

## 2024-09-08 ENCOUNTER — Telehealth: Payer: Self-pay

## 2024-09-08 NOTE — Telephone Encounter (Signed)
 Left detailed message informing patient that PCP is out of office and no rx can be sent. Patient advised to contact office to schedule appointment or proceed to nearest urgent care for treatment

## 2024-09-08 NOTE — Telephone Encounter (Signed)
 Copied from CRM #[id number]. Topic: Clinical - Medication Question >> Sep 05, 2024  3:58 PM Mia F wrote: Reason for CRM: Pt calling to see if she could get a script for TAMIFLU as her son just tested positive for flu and she would like to use the medication as a preventative. Please send to [id number] [place], [place] - [address] AT [facility] OF [facility] [address] & [facility] [address] [place] [address] Phone: [phone] Fax: [phone]

## 2024-09-09 DIAGNOSIS — N950 Postmenopausal bleeding: Secondary | ICD-10-CM | POA: Diagnosis not present

## 2024-09-09 DIAGNOSIS — I82452 Acute embolism and thrombosis of left peroneal vein: Secondary | ICD-10-CM | POA: Diagnosis not present

## 2024-09-10 ENCOUNTER — Ambulatory Visit: Payer: Self-pay | Admitting: Nurse Practitioner

## 2024-09-15 ENCOUNTER — Telehealth: Payer: Self-pay

## 2024-09-15 ENCOUNTER — Other Ambulatory Visit: Payer: Self-pay | Admitting: Family Medicine

## 2024-09-15 NOTE — Telephone Encounter (Unsigned)
 Copied from CRM [id number]. Topic: Clinical - Medication Refill >> Sep 15, 2024  4:35 PM DeAngela L wrote: Medication: amphetamine -dextroamphetamine (ADDERALL XR) 25 MG 24 hr capsule  Has the patient contacted their pharmacy? No  (Agent: If no, request that the patient contact the pharmacy for the refill. If patient does not wish to contact the pharmacy document the reason why and proceed with request.) (Agent: If yes, when and what did the pharmacy advise?)  This is the patient's preferred pharmacy:  [id number] [place], [place] - [address] AT [facility] OF [facility] [address] & [facility] [address] [place] [address] Phone: [phone] Fax: [phone]  Is this the correct pharmacy for this prescription? Yes  If no, delete pharmacy and type the correct one.   Has the prescription been filled recently? Yes   Is the patient out of the medication? Yes   Has the patient been seen for an appointment in the last year OR does the patient have an upcoming appointment? Yes   Can we respond through MyChart? Yes   Agent: Please be advised that Rx refills may take up to 3 business days. We ask that you follow-up with your pharmacy.

## 2024-09-15 NOTE — Telephone Encounter (Signed)
>>   Sep 15, 2024  4:39 PM DeAngela L wrote:  patient would like the office to understand that she ran out of medication yesterday and this will be 4 days without of medication   Copied from CRM [id number]. Topic: Clinical - Medication Refill >> Sep 15, 2024  4:35 PM DeAngela L wrote: Medication: amphetamine -dextroamphetamine (ADDERALL XR) 25 MG 24 hr capsule  Has the patient contacted their pharmacy? No  (Agent: If no, request that the patient contact the pharmacy for the refill. If patient does not wish to contact the pharmacy document the reason why and proceed with request.) (Agent: If yes, when and what did the pharmacy advise?)  This is the patient's preferred pharmacy:  [facility] [id number] [place], [place] - [address] AT [facility] [address] [facility] [address] & [facility] [address] [place] [address] Phone: [phone] Fax: [phone]  Is this the correct pharmacy for this prescription? Yes  If no, delete pharmacy and type the correct one.   Has the prescription been filled recently? Yes   Is the patient out of the medication? Yes   Has the patient been seen for an appointment in the last year OR does the patient have an upcoming appointment? Yes   Can we respond through MyChart? Yes   Agent: Please be advised that Rx refills may take up to 3 business days. We ask that you follow-up with your pharmacy.

## 2024-09-16 MED ORDER — AMPHETAMINE-DEXTROAMPHET ER 25 MG PO CP24: 25.0000 mg | ORAL_CAPSULE | ORAL | 0 refills | Status: DC

## 2024-09-16 NOTE — Telephone Encounter (Signed)
 Wrong office

## 2024-09-18 ENCOUNTER — Encounter: Payer: Self-pay | Admitting: Family Medicine

## 2024-09-18 DIAGNOSIS — Z881 Allergy status to other antibiotic agents status: Secondary | ICD-10-CM | POA: Diagnosis not present

## 2024-09-18 DIAGNOSIS — G43909 Migraine, unspecified, not intractable, without status migrainosus: Secondary | ICD-10-CM | POA: Diagnosis not present

## 2024-09-18 DIAGNOSIS — R1084 Generalized abdominal pain: Secondary | ICD-10-CM

## 2024-09-18 DIAGNOSIS — Z888 Allergy status to other drugs, medicaments and biological substances status: Secondary | ICD-10-CM | POA: Diagnosis not present

## 2024-09-18 DIAGNOSIS — Z79890 Hormone replacement therapy (postmenopausal): Secondary | ICD-10-CM | POA: Diagnosis not present

## 2024-09-18 DIAGNOSIS — Z7901 Long term (current) use of anticoagulants: Secondary | ICD-10-CM | POA: Diagnosis not present

## 2024-09-18 DIAGNOSIS — I10 Essential (primary) hypertension: Secondary | ICD-10-CM | POA: Diagnosis not present

## 2024-09-18 DIAGNOSIS — Z79899 Other long term (current) drug therapy: Secondary | ICD-10-CM | POA: Diagnosis not present

## 2024-09-18 DIAGNOSIS — Z85850 Personal history of malignant neoplasm of thyroid: Secondary | ICD-10-CM | POA: Diagnosis not present

## 2024-09-18 DIAGNOSIS — Z885 Allergy status to narcotic agent status: Secondary | ICD-10-CM | POA: Diagnosis not present

## 2024-09-18 DIAGNOSIS — N950 Postmenopausal bleeding: Secondary | ICD-10-CM | POA: Diagnosis not present

## 2024-09-18 DIAGNOSIS — E038 Other specified hypothyroidism: Secondary | ICD-10-CM | POA: Diagnosis not present

## 2024-09-18 DIAGNOSIS — N840 Polyp of corpus uteri: Secondary | ICD-10-CM | POA: Diagnosis not present

## 2024-09-18 DIAGNOSIS — Z98891 History of uterine scar from previous surgery: Secondary | ICD-10-CM | POA: Diagnosis not present

## 2024-09-18 DIAGNOSIS — Z86718 Personal history of other venous thrombosis and embolism: Secondary | ICD-10-CM | POA: Diagnosis not present

## 2024-09-19 DIAGNOSIS — C73 Malignant neoplasm of thyroid gland: Secondary | ICD-10-CM | POA: Diagnosis not present

## 2024-09-19 DIAGNOSIS — Z85850 Personal history of malignant neoplasm of thyroid: Secondary | ICD-10-CM | POA: Diagnosis not present

## 2024-09-19 DIAGNOSIS — H02823 Cysts of right eye, unspecified eyelid: Secondary | ICD-10-CM | POA: Diagnosis not present

## 2024-09-19 DIAGNOSIS — R682 Dry mouth, unspecified: Secondary | ICD-10-CM | POA: Diagnosis not present

## 2024-09-19 DIAGNOSIS — H04123 Dry eye syndrome of bilateral lacrimal glands: Secondary | ICD-10-CM | POA: Diagnosis not present

## 2024-10-14 DIAGNOSIS — N950 Postmenopausal bleeding: Secondary | ICD-10-CM | POA: Diagnosis not present

## 2024-10-14 DIAGNOSIS — N840 Polyp of corpus uteri: Secondary | ICD-10-CM | POA: Diagnosis not present

## 2024-10-20 ENCOUNTER — Encounter: Payer: Self-pay | Admitting: Family Medicine

## 2024-10-20 ENCOUNTER — Ambulatory Visit: Admitting: Family Medicine

## 2024-10-20 VITALS — BP 150/90 | HR 81 | Wt 208.6 lb

## 2024-10-20 DIAGNOSIS — R1010 Upper abdominal pain, unspecified: Secondary | ICD-10-CM

## 2024-10-20 DIAGNOSIS — R1084 Generalized abdominal pain: Secondary | ICD-10-CM

## 2024-10-20 DIAGNOSIS — R058 Other specified cough: Secondary | ICD-10-CM | POA: Diagnosis not present

## 2024-10-20 MED ORDER — DOXYCYCLINE HYCLATE 100 MG PO CAPS: 100.0000 mg | ORAL_CAPSULE | Freq: Two times a day (BID) | ORAL | 0 refills | Status: DC

## 2024-10-20 NOTE — Progress Notes (Signed)
 Established Patient Office Visit  Subjective   Patient ID: Jennifer Shepard, female    DOB: 09-Jan-1966  Age: 58 y.o. MRN: [id number]  Chief Complaint  Patient presents with   Cough   Sinusitis    HPI   Jennifer Shepard is seen for upper respiratory symptoms and progressive productive cough and sinusitis symptoms.  She started with cold-like symptoms over a week ago.  She took Mucinex and hydrated well but feels like her cough has gotten worse.  She had some initial low-grade fever but none since then.  Initially had severe sore throat and that is improved but her cough is worsened.  Cough now productive.  She was at a concert in [place] last week and feels like she may have been exposed to something then.  She is a non-smoker.  No chronic lung disease.  History of allergy to Ceclor.  Other issue is persistent abdominal discomfort and bloating.  She was seen back early September with abdominal pain which was relatively diffuse and at that point he had some diarrhea.  She was relating diffuse abdominal cramps and decreased appetite.  She had some modest weight loss.  Her mom had died of pancreas cancer and patient was naturally very concerned.  We obtained CAT scan abdomen and pelvis that showed thickened endometrium.  There is mention of gallbladder wall thickening and contraction without cholelithiasis.  Patient saw her GYN and had pelvic ultrasound and eventually D&C but no abnormal cells noted.  She is concerned about the abnormal findings of gallbladder above.  She states after eating she frequently has nausea without vomiting and bloating.  Nonlocalizing pain upper abdomen at times.  No reported fever.  Weight is down just couple pounds from her visit in September  She had nonspecific mild elevation of lipase at 90-month ago.  CBC normal other than mildly elevated platelets.  She has pending follow-up with GI in December  Past Medical History:  Diagnosis Date   Benign essential HTN    Cancer  [facility])    Thyroid    Past Surgical History:  Procedure Laterality Date   APPENDECTOMY     BREAST CYST ASPIRATION Right pt unsure   no visible scar   CESAREAN SECTION  01/2004   CESAREAN SECTION  10/2005   INTRAUTERINE DEVICE INSERTION  06/26/2015   Mirena    LASIK     THYROIDECTOMY  07/2009   FOR THYROID  CANCER; Dr Graig    reports that she has never smoked. She has never used smokeless tobacco. She reports current alcohol use. She reports that she does not use drugs. family history includes CAD in her maternal grandmother; Hyperlipidemia in her father; Hypertension in her maternal grandmother and mother; Pancreatic cancer in her mother. Allergies  Allergen Reactions   Cefaclor Other (See Comments)    Angioedema of lips   Morphine And Codeine Other (See Comments)    Severe headache   Tyloxapol    Oxycodone-Acetaminophen  Itching    itching    Review of Systems  Constitutional:  Negative for chills and fever.  Respiratory:  Positive for cough and sputum production. Negative for hemoptysis and wheezing.   Cardiovascular:  Negative for chest pain.  Gastrointestinal:  Positive for abdominal pain and nausea. Negative for blood in stool, constipation, diarrhea, melena and vomiting.      Objective:     BP (!) 150/90   Pulse 81   Wt 208 lb 9.6 oz (94.6 kg)   LMP  (LMP Unknown)   SpO2  96%   BMI 31.26 kg/m  BP Readings from Last 3 Encounters:  10/20/24 (!) 150/90  09/04/24 110/64  08/28/24 122/80   Wt Readings from Last 3 Encounters:  10/20/24 208 lb 9.6 oz (94.6 kg)  08/28/24 211 lb (95.7 kg)  08/27/24 210 lb 9.6 oz (95.5 kg)      Physical Exam Vitals reviewed.  Constitutional:      General: She is not in acute distress.    Appearance: She is not ill-appearing.  Cardiovascular:     Rate and Rhythm: Normal rate and regular rhythm.  Pulmonary:     Effort: Pulmonary effort is normal.     Breath sounds: Normal breath sounds. No wheezing or rales.  Neurological:      Mental Status: She is alert.      No results found for any visits on 10/20/24.  Last CBC Lab Results  Component Value Date   WBC 7.8 08/27/2024   HGB 13.3 08/27/2024   HCT 39.9 08/27/2024   MCV 93.9 08/27/2024   MCH 30.5 03/06/2024   RDW 13.2 08/27/2024   PLT 479.0 (H) 08/27/2024   Last metabolic panel Lab Results  Component Value Date   GLUCOSE 95 08/27/2024   NA 142 08/27/2024   K 5.8 No hemolysis seen (H) 08/27/2024   CL 107 08/27/2024   CO2 24 08/27/2024   BUN 9 08/27/2024   CREATININE 1.15 08/27/2024   GFR 53.08 (L) 08/27/2024   CALCIUM 9.6 08/27/2024   PROT 6.6 08/27/2024   ALBUMIN 3.9 08/27/2024   BILITOT 0.5 08/27/2024   ALKPHOS 68 08/27/2024   AST 14 08/27/2024   ALT 13 08/27/2024   ANIONGAP 10 05/29/2017   Last thyroid  functions Lab Results  Component Value Date   TSH 1.38 05/05/2022   FREET4 0.71 05/05/2022      The 10-year ASCVD risk score (Arnett DK, et al., 2019) is: 4%    Assessment & Plan:   #1 productive cough.  We explained this all may be viral but she has had progressive productive cough and sinusitis symptoms following with sounds like viral URI last week.  Will start doxycycline 100 mg twice daily for 10 days.  Follow-up promptly for any fever or worsening symptoms  #2 several week history of persistent abdominal bloating and upper abdominal pain and nausea without vomiting after eating.  She had comment of gallbladder wall thickening and contraction without gallstones on recent CT abdomen pelvis.  She has pending follow-up with GI but will not see them until December.  We discussed going ahead with HIDA scan to further assess  Jennifer Scarlet, MD

## 2024-10-22 ENCOUNTER — Telehealth: Payer: Self-pay

## 2024-10-22 NOTE — Telephone Encounter (Signed)
 Patient informed of the message below and voiced understanding

## 2024-10-22 NOTE — Telephone Encounter (Signed)
 Copied from CRM #[id number]. Topic: Clinical - Medication Question >> Oct 22, 2024 10:35 AM Dedra B wrote: Reason for CRM: Pt would like a call regarding the doxycycline she was prescribed Monday. She said she took it on an empty stomach and vomited within 30 minutes of her taking it. Pls call pt at [phone].

## 2024-10-27 ENCOUNTER — Encounter (HOSPITAL_COMMUNITY)

## 2024-10-28 ENCOUNTER — Encounter (HOSPITAL_COMMUNITY)
Admission: RE | Admit: 2024-10-28 | Discharge: 2024-10-28 | Disposition: A | Discharge status: Home / Self Care | Source: Ambulatory Visit | Attending: Family Medicine | Admitting: Family Medicine

## 2024-10-28 DIAGNOSIS — R1084 Generalized abdominal pain: Secondary | ICD-10-CM | POA: Insufficient documentation

## 2024-10-28 DIAGNOSIS — G8929 Other chronic pain: Secondary | ICD-10-CM | POA: Diagnosis not present

## 2024-10-28 DIAGNOSIS — R1010 Upper abdominal pain, unspecified: Secondary | ICD-10-CM | POA: Diagnosis not present

## 2024-10-28 MED ORDER — TECHNETIUM TC 99M MEBROFENIN IV KIT
5.2000 | PACK | Freq: Once | INTRAVENOUS | Status: AC | PRN
  Administered 2024-10-28: 5.2 via INTRAVENOUS

## 2024-10-29 ENCOUNTER — Ambulatory Visit: Payer: Self-pay | Admitting: Family Medicine

## 2024-10-30 ENCOUNTER — Telehealth: Payer: Self-pay

## 2024-10-30 NOTE — Telephone Encounter (Signed)
 Please see result note

## 2024-10-30 NOTE — Telephone Encounter (Signed)
 Copied from CRM [id number]. Topic: Clinical - Medical Advice >> Oct 30, 2024  9:01 AM Jennifer Shepard wrote: Reason for CRM:  Patient is returning [name], CMA phone call.

## 2024-10-31 ENCOUNTER — Ambulatory Visit: Admitting: Physician Assistant

## 2024-10-31 NOTE — Progress Notes (Deleted)
 Jennifer Console, Jennifer Shepard [address] [place], [place]  [address] Phone: [phone]   Gastroenterology Consultation  Referring Provider:     Micheal Jennifer ORN, Jennifer Shepard Primary Care Physician:  Jennifer Jennifer ORN, Jennifer Shepard Primary Gastroenterologist:  Jennifer Console, Jennifer Shepard / *** Reason for Consultation:     Abdominal pain        HPI:   Discussed the use of AI scribe software for clinical note transcription with the patient, who gave verbal consent to proceed.  New patient.  Here to evaluate upper abdominal pain.  She is not on PPI or H2 RB.  Currently on doxycycline.  10/28/24 HIDA scan (for chronic upper abdominal pain): Gallbladder ejection fraction 96%, consistent with hyperkinesia.  08/27/2024 labs: Mild elevated lipase 65, mild elevated platelets 479, otherwise normal CBC, CMP, lipase.    08/29/2024 CT abdomen pelvis with contrast: - Thickened heterogeneous endometrium incompletely assessed on current CT. Underlying malignancy can not be excluded. Recommend dedicated pelvic ultrasound for further assessment.  - No suspicious findings to suggest metastatic disease from thyroid  primary. - Colonic diverticula without diverticulitis.  - Gallbladder wall thickening and contraction without cholelithiasis.  - Few subcentimeter mesenteric root lymph nodes not enlarged by CT size criteria, likely reactive.  Pelvic transvaginal ultrasound 09/02/2024: 2 intramural fibroids, largest 15 mm.  Right ovary normal.  Left ovary not seen.  She followed up with GYN and had endometrial biopsy 09/04/2024. History of Present Illness    11/2021 screening colonoscopy through [facility]: Normal.  10-year repeat.  PMH:  ADD, elevated blood pressure, IUD, history of thyroid  cancer in 2010 s/p thyroidectomy and radioactive iodine, migraines, HSV, appendectomy.  Currently on Eliquis.  Past Medical History:  Diagnosis Date   Benign essential HTN    Cancer (HCC)    Thyroid     Past Surgical History:   Procedure Laterality Date   APPENDECTOMY     BREAST CYST ASPIRATION Right pt unsure   no visible scar   CESAREAN SECTION  01/2004   CESAREAN SECTION  10/2005   INTRAUTERINE DEVICE INSERTION  06/26/2015   Mirena    LASIK     THYROIDECTOMY  07/2009   FOR THYROID  CANCER; Dr Graig    Prior to Admission medications   Medication Sig Start Date End Date Taking? Authorizing Provider  amphetamine -dextroamphetamine (ADDERALL XR) 25 MG 24 hr capsule Take 1 capsule by mouth every morning. 09/16/24   Jennifer Shepard, Jennifer GRADE, Jennifer Shepard  ARMOUR THYROID  60 MG tablet Take 120 mg by mouth daily.  08/26/15   Provider, Historical, Jennifer Shepard  cevimeline (EVOXAC) 30 MG capsule Take 30 mg by mouth 3 (three) times daily.    Provider, Historical, Jennifer Shepard  Cholecalciferol (VITAMIN D  PO) Take 5,000 Units by mouth daily.     Provider, Historical, Jennifer Shepard  doxycycline (VIBRAMYCIN) 100 MG capsule Take 1 capsule (100 mg total) by mouth 2 (two) times daily. 10/20/24   Jennifer Shepard, Jennifer ORN, Jennifer Shepard  ELIQUIS 5 MG TABS tablet Take 5 mg by mouth 2 (two) times daily.    Provider, Historical, Jennifer Shepard  metoprolol  succinate (TOPROL -XL) 50 MG 24 hr tablet Take 1 tablet (50 mg total) by mouth daily. Patient taking differently: Take 50 mg by mouth in the morning and at bedtime. 07/13/20   Jennifer Shepard, Jennifer ORN, Jennifer Shepard  Probiotic Product (PROBIOTIC DAILY PO) Take 1 tablet by mouth daily.    Provider, Historical, Jennifer Shepard  verapamil (CALAN-SR) 120 MG CR tablet 1 tablet Orally Once a day; Duration: 30 day(s) 10/29/19  Provider, Historical, Jennifer Shepard    Family History  Problem Relation Age of Onset   Hypertension Mother    Pancreatic cancer Mother    Hyperlipidemia Father    Hypertension Maternal Grandmother    CAD Maternal Grandmother        CBAG in 22s; pacer   Cancer Neg Hx    Diabetes Neg Hx    Stroke Neg Hx    Breast cancer Neg Hx      Social History   Tobacco Use   Smoking status: Never   Smokeless tobacco: Never  Vaping Use   Vaping status: Never Used  Substance  Use Topics   Alcohol use: Yes    Comment: SOCIALLY ONLY   Drug use: No    Allergies as of 10/31/2024 - Review Complete 10/22/2024  Allergen Reaction Noted   Cefaclor Other (See Comments)    Morphine and codeine Other (See Comments) 07/26/2017   Tyloxapol  01/13/2019   Oxycodone-acetaminophen  Itching 03/27/2011    Review of Systems:    All systems reviewed and negative except where noted in HPI.   Physical Exam:  LMP  (LMP Unknown)  No LMP recorded (lmp unknown). Patient is postmenopausal.  General:   Alert,  Well-developed, well-nourished, pleasant and cooperative in NAD Lungs:  Respirations even and unlabored.  Clear throughout to auscultation.   No wheezes, crackles, or rhonchi. No acute distress. Heart:  Regular rate and rhythm; no murmurs, clicks, rubs, or gallops. Abdomen:  Normal bowel sounds.  No bruits.  Soft, and non-distended without masses, hepatosplenomegaly or hernias noted.  No Tenderness.  No guarding or rebound tenderness.    Neurologic:  Alert and oriented x3;  grossly normal neurologically. Psych:  Alert and cooperative. Normal mood and affect.   Imaging Studies: NM Hepato W/Eject Fract Result Date: 10/29/2024 CLINICAL DATA:  Chronic upper abdominal pain. EXAM: NUCLEAR MEDICINE HEPATOBILIARY IMAGING WITH GALLBLADDER EF TECHNIQUE: Sequential images of the abdomen were obtained out to 60 minutes following intravenous administration of radiopharmaceutical. After oral ingestion of Ensure, gallbladder ejection fraction was determined. At 60 min, normal ejection fraction is greater than 33%. RADIOPHARMACEUTICALS:  5.2 mCi Tc-77m  Choletec IV COMPARISON:  CT August 29, 2024 FINDINGS: Prompt uptake and biliary excretion of activity by the liver is seen. Gallbladder activity is visualized, consistent with patency of cystic duct. Biliary activity passes into small bowel, consistent with patent common bile duct. Calculated gallbladder ejection fraction is 96%. (Normal  gallbladder ejection fraction with Ensure is greater than 33% and less than 80%.) IMPRESSION: 1.  Patent cystic and common bile ducts. 2. Elevated gallbladder ejection fraction as can be seen with gallbladder hyperkinesia. Electronically Signed   By: Jennifer Holder M.D.   On: 10/29/2024 07:20    Labs: CBC    Component Value Date/Time   WBC 7.8 08/27/2024 1442   RBC 4.25 08/27/2024 1442   HGB 13.3 08/27/2024 1442   HCT 39.9 08/27/2024 1442   PLT 479.0 (H) 08/27/2024 1442   MCV 93.9 08/27/2024 1442    CMP     Component Value Date/Time   NA 142 08/27/2024 1442   NA 139 09/11/2019 0841   K 5.8 No hemolysis seen (H) 08/27/2024 1442   CL 107 08/27/2024 1442   CO2 24 08/27/2024 1442   GLUCOSE 95 08/27/2024 1442   BUN 9 08/27/2024 1442   BUN 14 09/11/2019 0841   CREATININE 1.15 08/27/2024 1442   CREATININE 0.69 03/06/2024 1042   CALCIUM 9.6 08/27/2024 1442   PROT  6.6 08/27/2024 1442   ALBUMIN 3.9 08/27/2024 1442   AST 14 08/27/2024 1442   ALT 13 08/27/2024 1442   ALKPHOS 68 08/27/2024 1442   BILITOT 0.5 08/27/2024 1442   GFRNONAA 101 09/11/2019 0841   GFRAA 116 09/11/2019 0841    Assessment and Plan:   Jennifer Shepard is a 58 y.o. y/o female has been referred for   1.  Upper abdominal pain - -Check H. Pylori - EGD - PPI, H2 RB  Assessment and Plan Assessment & Plan       Follow up ***  Jennifer Console, Jennifer Shepard

## 2024-11-17 ENCOUNTER — Other Ambulatory Visit: Payer: Self-pay | Admitting: Family Medicine

## 2024-11-17 MED ORDER — AMPHETAMINE-DEXTROAMPHET ER 25 MG PO CP24: 25.0000 mg | ORAL_CAPSULE | ORAL | 0 refills | Status: AC

## 2024-11-17 NOTE — Telephone Encounter (Signed)
 Copied from CRM #[id number]. Topic: Clinical - Medication Refill >> Nov 17, 2024  2:59 PM Nessti S wrote: Medication: amphetamine -dextroamphetamine (ADDERALL XR) 25 MG 24 hr capsule   Has the patient contacted their pharmacy? No (Agent: If no, request that the patient contact the pharmacy for the refill. If patient does not wish to contact the pharmacy document the reason why and proceed with request.) (Agent: If yes, when and what did the pharmacy advise?)  This is the patient's preferred pharmacy:  [id number] [place], [place] - [address] AT [facility] [address] [facility] [address] [place] [address] Phone: [phone] Fax: [phone]  Is this the correct pharmacy for this prescription? Yes If no, delete pharmacy and type the correct one.   Has the prescription been filled recently? Yes  Is the patient out of the medication? No  Has the patient been seen for an appointment in the last year OR does the patient have an upcoming appointment? Yes  Can we respond through MyChart? Yes  Agent: Please be advised that Rx refills may take up to 3 business days. We ask that you follow-up with your pharmacy.

## 2024-11-30 ENCOUNTER — Encounter: Payer: Self-pay | Admitting: Family Medicine

## 2024-12-01 ENCOUNTER — Ambulatory Visit: Admitting: Gastroenterology

## 2024-12-02 ENCOUNTER — Ambulatory Visit: Admitting: Gastroenterology

## 2024-12-05 ENCOUNTER — Encounter: Payer: Self-pay | Admitting: Gastroenterology

## 2024-12-05 ENCOUNTER — Ambulatory Visit: Admitting: Gastroenterology

## 2024-12-05 ENCOUNTER — Other Ambulatory Visit

## 2024-12-05 VITALS — BP 146/100 | HR 99 | Ht 69.0 in | Wt 214.5 lb

## 2024-12-05 DIAGNOSIS — K828 Other specified diseases of gallbladder: Secondary | ICD-10-CM | POA: Diagnosis not present

## 2024-12-05 DIAGNOSIS — R1013 Epigastric pain: Secondary | ICD-10-CM

## 2024-12-05 DIAGNOSIS — R112 Nausea with vomiting, unspecified: Secondary | ICD-10-CM | POA: Diagnosis not present

## 2024-12-05 LAB — COMPREHENSIVE METABOLIC PANEL WITH GFR
ALT: 11 U/L (ref 0–35)
AST: 15 U/L (ref 0–37)
Albumin: 4.2 g/dL (ref 3.5–5.2)
Alkaline Phosphatase: 69 U/L (ref 39–117)
BUN: 13 mg/dL (ref 6–23)
CO2: 31 meq/L (ref 19–32)
Calcium: 10.1 mg/dL (ref 8.4–10.5)
Chloride: 102 meq/L (ref 96–112)
Creatinine, Ser: 0.73 mg/dL (ref 0.40–1.20)
GFR: 91.41 mL/min (ref >60.00)
Glucose, Bld: 96 mg/dL (ref 70–99)
Potassium: 5.1 meq/L (ref 3.5–5.1)
Sodium: 140 meq/L (ref 135–145)
Total Bilirubin: 0.6 mg/dL (ref 0.2–1.2)
Total Protein: 7 g/dL (ref 6.0–8.3)

## 2024-12-05 LAB — CBC WITH DIFFERENTIAL/PLATELET
Basophils Absolute: 0.1 K/uL (ref 0.0–0.1)
Basophils Relative: 1.2 % (ref 0.0–3.0)
Eosinophils Absolute: 0.1 K/uL (ref 0.0–0.7)
Eosinophils Relative: 1.3 % (ref 0.0–5.0)
HCT: 39.6 % (ref 36.0–46.0)
Hemoglobin: 13.5 g/dL (ref 12.0–15.0)
Lymphocytes Relative: 29.2 % (ref 12.0–46.0)
Lymphs Abs: 1.6 K/uL (ref 0.7–4.0)
MCHC: 34 g/dL (ref 30.0–36.0)
MCV: 94.5 fl (ref 78.0–100.0)
Monocytes Absolute: 0.5 K/uL (ref 0.1–1.0)
Monocytes Relative: 9.6 % (ref 3.0–12.0)
Neutro Abs: 3.3 K/uL (ref 1.4–7.7)
Neutrophils Relative %: 58.7 % (ref 43.0–77.0)
Platelets: 285 K/uL (ref 150.0–400.0)
RBC: 4.19 Mil/uL (ref 3.87–5.11)
RDW: 13.5 % (ref 11.5–15.5)
WBC: 5.6 K/uL (ref 4.0–10.5)

## 2024-12-05 NOTE — Patient Instructions (Addendum)
 You have been scheduled for an appointment with ___ at [facility]. Your appointment is on ____ at ____. Please arrive at ____ for registration. Make certain to bring a list of current medications, including any over the counter medications or vitamins. Also bring your co-pay if you have one as well as your insurance cards. [facility] is located at [address], [address]. Should you need to reschedule your appointment, please contact them at [phone].  Your provider has requested that you go to the basement level for lab work before leaving today. Press B on the elevator. The lab is located at the first door on the left as you exit the elevator.  _______________________________________________________  If your blood pressure at your visit was 140/90 or greater, please contact your primary care physician to follow up on this.  _______________________________________________________  If you are age 40 or older, your body mass index should be between 23-30. Your Body mass index is 31.68 kg/m. If this is out of the aforementioned range listed, please consider follow up with your Primary Care Provider.  If you are age 62 or younger, your body mass index should be between 19-25. Your Body mass index is 31.68 kg/m. If this is out of the aformentioned range listed, please consider follow up with your Primary Care Provider.   ________________________________________________________  The [facility] providers would like to encourage you to use MYCHART to communicate with providers for non-urgent requests or questions.  Due to long hold times on the telephone, sending your provider a message by [facility] may be a faster and more efficient way to get a response.  Please allow 48 business hours for a response.  Please remember that this is for non-urgent requests.  _______________________________________________________  [facility] is using a team-based  approach to care.  Your team is made up of your doctor and two to three APPS. Our APPS (Nurse Practitioners and Physician Assistants) work with your physician to ensure care continuity for you. They are fully qualified to address your health concerns and develop a treatment plan. They communicate directly with your gastroenterologist to care for you. Seeing the Advanced Practice Practitioners on your physician's team can help you by facilitating care more promptly, often allowing for earlier appointments, access to diagnostic testing, procedures, and other specialty referrals.    Due to recent changes in healthcare laws, you may see the results of your imaging and laboratory studies on MyChart before your provider has had a chance to review them.  We understand that in some cases there may be results that are confusing or concerning to you. Not all laboratory results come back in the same time frame and the provider may be waiting for multiple results in order to interpret others.  Please give us  48 hours in order for your provider to thoroughly review all the results before contacting the office for clarification of your results.

## 2024-12-05 NOTE — Progress Notes (Signed)
 Chief Complaint: Primary GI MD: Unassigned  HPI: 58 year old female history of thyroid  cancer s/p thyroidectomy, and others as listed below presents for evaluation of abnormal imaging  Discussed the use of AI scribe software for clinical note transcription with the patient, who gave verbal consent to proceed.  History of Present Illness   She experienced a severe episode of illness characterized by extreme fatigue, inability to move, eat, or drink, accompanied by diarrhea and intense abdominal cramping. This episode lasted for about a week to ten days and was described as the worst she has ever felt. Initial medical evaluation included tests for influenza and COVID-19, both of which were negative. She was informed it was a viral infection, which she doubted due to her mother's recent history of being misdiagnosed with a virus before being diagnosed with pancreatic cancer.  Subsequent evaluations included a CT scan showing gallbladder wall thickening and a HIDA scan indicating a gallbladder ejection fraction of 96%. An ultrasound and a D&C performed by her gynecologist revealed endometrial lining thickening and a polyp. She experienced persistent nausea, initially occurring multiple times a day, now reduced to about once a week. She is concerned about the cause of her symptoms and the possibility of a serious underlying condition.  Her past medical workup included blood tests showing a lipase level of 65, but she believes it might have been higher during her acute illness. She also had elevated platelets and hyperkalemia in September, which are being monitored.  She consumes alcohol regularly, drinking two to six beers daily, but reports no unusual increase in consumption around the time of her illness. No unusual dietary intake during that period.  Family history is significant for her mother's death from pancreatic cancer at age 63, which contributes to her concern about her own symptoms. She  is planning to travel out of the country on December 26th and is anxious about managing her symptoms during this time.    PREVIOUS GI WORKUP   HIDA scan 10/2024 for chronic upper abdominal pain with gallbladder ejection fraction 96%  CT abdomen pelvis with contrast 08/29/2024 for abdominal pain -- Thickened heterogeneous endometrium incompletely assessed on current CT. Underlying malignancy can not be excluded. Recommend dedicated pelvic ultrasound for further assessment.  --- No suspicious findings to suggest metastatic disease from thyroid  primary. --Colonic diverticula without diverticulitis. --Gallbladder wall thickening and contraction without cholelithiasis.  --Few subcentimeter mesenteric root lymph nodes not enlarged by CT size criteria, likely reactive.  Colonoscopy 11/2021 (Dr. Emeline) - Normal - Repeat 10 years  Past Medical History:  Diagnosis Date   Acute deep vein thrombosis (DVT) of left peroneal vein (HCC)    ADHD (attention deficit hyperactivity disorder)    Benign essential HTN    Cancer (HCC)    Thyroid    Hyperlipidemia    Hypothyroidism associated with surgical procedure    Migraine headache     Past Surgical History:  Procedure Laterality Date   APPENDECTOMY     BREAST CYST ASPIRATION Right pt unsure   no visible scar   CESAREAN SECTION  01/2004   CESAREAN SECTION  10/2005   INTRAUTERINE DEVICE INSERTION  06/26/2015   Mirena    LASIK     THYROIDECTOMY  07/2009   FOR THYROID  CANCER; Dr Graig    Current Outpatient Medications  Medication Sig Dispense Refill   amphetamine -dextroamphetamine (ADDERALL XR) 25 MG 24 hr capsule Take 1 capsule by mouth every morning. 90 capsule 0   ARMOUR THYROID  60 MG tablet Take 120  mg by mouth daily.   0   cevimeline (EVOXAC) 30 MG capsule Take 30 mg by mouth 3 (three) times daily.     Cholecalciferol (VITAMIN D  PO) Take 5,000 Units by mouth daily.      ELIQUIS 5 MG TABS tablet Take 5 mg by mouth 2 (two) times daily.      metoprolol  succinate (TOPROL -XL) 50 MG 24 hr tablet Take 1 tablet (50 mg total) by mouth daily. (Patient taking differently: Take 50 mg by mouth in the morning and at bedtime.) 30 tablet 0   Probiotic Product (PROBIOTIC DAILY PO) Take 1 tablet by mouth daily.     verapamil (CALAN-SR) 120 MG CR tablet 1 tablet Orally Once a day; Duration: 30 day(s)     No current facility-administered medications for this visit.    Allergies as of 12/05/2024 - Review Complete 12/05/2024  Allergen Reaction Noted   Cefaclor Other (See Comments)    Morphine and codeine Other (See Comments) 07/26/2017   Tyloxapol  01/13/2019   Oxycodone-acetaminophen  Itching 03/27/2011    Family History  Problem Relation Age of Onset   Hypertension Mother    Pancreatic cancer Mother    Hyperlipidemia Father    Hypertension Maternal Grandmother    CAD Maternal Grandmother        CBAG in 62s; pacer   Cancer Neg Hx    Diabetes Neg Hx    Stroke Neg Hx    Breast cancer Neg Hx     Social History   Socioeconomic History   Marital status: Divorced    Spouse name: Not on file   Number of children: Not on file   Years of education: Not on file   Highest education level: Not on file  Occupational History   Not on file  Tobacco Use   Smoking status: Never   Smokeless tobacco: Never  Vaping Use   Vaping status: Never Used  Substance and Sexual Activity   Alcohol use: Yes    Comment: SOCIALLY ONLY   Drug use: No   Sexual activity: Not Currently    Partners: Male    Birth control/protection: Post-menopausal    Comment: 1st intercourse 58 yo-More than 5 partners  Other Topics Concern   Not on file  Social History Narrative   Not on file   Social Drivers of Health   Tobacco Use: Low Risk (10/20/2024)   Patient History    Smoking Tobacco Use: Never    Smokeless Tobacco Use: Never    Passive Exposure: Not on file  Financial Resource Strain: Not on file  Food Insecurity: Not on file  Transportation Needs:  Not on file  Physical Activity: Not on file  Stress: Not on file  Social Connections: Unknown (05/07/2022)   Received from [facility]   Social Network    Social Network: Not on file  Intimate Partner Violence: Unknown (03/29/2022)   Received from [facility]   HITS    Physically Hurt: Not on file    Insult or Talk Down To: Not on file    Threaten Physical Harm: Not on file    Scream or Curse: Not on file  Depression (PHQ2-9): Low Risk (03/06/2024)   Depression (PHQ2-9)    PHQ-2 Score: 0  Alcohol Screen: Not on file  Housing: Not on file  Utilities: Not on file  Health Literacy: Not on file    Review of Systems:    Constitutional: No weight loss, fever, chills, weakness or fatigue HEENT: Eyes: No  change in vision               Ears, Nose, Throat:  No change in hearing or congestion Skin: No rash or itching Cardiovascular: No chest pain, chest pressure or palpitations   Respiratory: No SOB or cough Gastrointestinal: See HPI and otherwise negative Genitourinary: No dysuria or change in urinary frequency Neurological: No headache, dizziness or syncope Musculoskeletal: No new muscle or joint pain Hematologic: No bleeding or bruising Psychiatric: No history of depression or anxiety    Physical Exam:  Vital signs: BP (!) 146/100   Pulse 99   Ht 5' 9 (1.753 m)   Wt 214 lb 8 oz (97.3 kg)   LMP  (LMP Unknown)   BMI 31.68 kg/m   Constitutional: NAD, alert and cooperative Head:  Normocephalic and atraumatic. Eyes:   PEERL, EOMI. No icterus. Conjunctiva pink. Respiratory: Respirations even and unlabored. Lungs clear to auscultation bilaterally.   No wheezes, crackles, or rhonchi.  Cardiovascular:  Regular rate and rhythm. No peripheral edema, cyanosis or pallor.  Gastrointestinal:  Soft, nondistended, nontender. No rebound or guarding. Normal bowel sounds. No appreciable masses or hepatomegaly. Rectal:  Declines Msk:  Symmetrical without gross deformities. Without edema,  no deformity or joint abnormality.  Neurologic:  Alert and  oriented x4;  grossly normal neurologically.  Skin:   Dry and intact without significant lesions or rashes. Psychiatric: Oriented to person, place and time. Demonstrates good judgement and reason without abnormal affect or behaviors.  Physical Exam    RELEVANT LABS AND IMAGING: CBC    Component Value Date/Time   WBC 7.8 08/27/2024 1442   RBC 4.25 08/27/2024 1442   HGB 13.3 08/27/2024 1442   HCT 39.9 08/27/2024 1442   PLT 479.0 (H) 08/27/2024 1442   MCV 93.9 08/27/2024 1442   MCH 30.5 03/06/2024 1042   MCHC 33.3 08/27/2024 1442   RDW 13.2 08/27/2024 1442   LYMPHSABS 2.3 08/27/2024 1442   MONOABS 0.7 08/27/2024 1442   EOSABS 0.0 08/27/2024 1442   BASOSABS 0.1 08/27/2024 1442    CMP     Component Value Date/Time   NA 142 08/27/2024 1442   NA 139 09/11/2019 0841   K 5.8 No hemolysis seen (H) 08/27/2024 1442   CL 107 08/27/2024 1442   CO2 24 08/27/2024 1442   GLUCOSE 95 08/27/2024 1442   BUN 9 08/27/2024 1442   BUN 14 09/11/2019 0841   CREATININE 1.15 08/27/2024 1442   CREATININE 0.69 03/06/2024 1042   CALCIUM 9.6 08/27/2024 1442   PROT 6.6 08/27/2024 1442   ALBUMIN 3.9 08/27/2024 1442   AST 14 08/27/2024 1442   ALT 13 08/27/2024 1442   ALKPHOS 68 08/27/2024 1442   BILITOT 0.5 08/27/2024 1442   GFRNONAA 101 09/11/2019 0841   GFRAA 116 09/11/2019 0841     Assessment/Plan:   Upper abdominal pain Nausea/vomiting CTAP with contrast with thickened gallbladder, normal pancreas.  HIDA scan with ejection fraction 96%.  No previous EGD.SABRA  CBC with platelets 479, BMP with hyperkalemia of 5.8, otherwise normal, lipase 65. 2-6 beers daily.  Possible patient had an episode of pancreatitis with her severity of epigastric pain, nausea, vomiting and then by the time she got labs and CT it had resolved.  Also possible she had symptomatic biliary dyskinesia.  Currently stable at this time.  Patient expresses concern as her  mother died of pancreatic cancer.  Reassuring pancreas on CT. Normal triglycerides -- IgG4 to rule out autoimmune pancreatitis, not suspicious for at  this time but we can rule out - CBC/CMP - Refer to [facility] for symptomatic biliary dyskinesia  Biliary dyskinesia Gallbladder wall thickening and impaired function with 96% ejection fraction on HIDA scan.  - Referred to surgeons for gallbladder removal evaluation. - Advised avoidance of fatty foods. - Recommended reducing alcohol consumption.  Colon cancer screening Colonoscopy 10/2021 done with digestive health specialist which was normal with 10-year recall - Repeat 2032  DVT On Eliquis  Assigned to Dr. Stacia today (Friday)  Jarryd Gratz Mollie RIGGERS [facility] 12/05/2024, 3:21 PM  Cc: Micheal Wolm ORN, MD

## 2024-12-06 LAB — IGG 4: IgG, Subclass 4: 6 mg/dL (ref 2–96)

## 2024-12-08 ENCOUNTER — Ambulatory Visit: Payer: Self-pay | Admitting: Gastroenterology

## 2024-12-14 NOTE — Progress Notes (Signed)
 Agree with the assessment and plan as outlined by Nestor Blower, PA-C.  Upper endoscopy would likely be requested by Surgery prior to consideration of cholecystectomy.

## 2025-01-13 ENCOUNTER — Ambulatory Visit (INDEPENDENT_AMBULATORY_CARE_PROVIDER_SITE_OTHER): Admitting: Family Medicine

## 2025-01-13 ENCOUNTER — Ambulatory Visit: Payer: Self-pay

## 2025-01-13 ENCOUNTER — Encounter: Payer: Self-pay | Admitting: Family Medicine

## 2025-01-13 VITALS — BP 140/80 | HR 86 | Temp 97.5°F | Wt 212.2 lb

## 2025-01-13 DIAGNOSIS — L03011 Cellulitis of right finger: Secondary | ICD-10-CM

## 2025-01-13 MED ORDER — SULFAMETHOXAZOLE-TRIMETHOPRIM 800-160 MG PO TABS: 1.0000 | ORAL_TABLET | Freq: Two times a day (BID) | ORAL | 0 refills | Status: AC

## 2025-01-13 NOTE — Patient Instructions (Signed)
 Do frequent salt water soaks for finger.  If you see any progressive redness, swelling or pain start the antibiotic.

## 2025-01-13 NOTE — Telephone Encounter (Signed)
 FYI Only or Action Required?: FYI only for provider: appointment scheduled on 01/13/25.  Patient was last seen in primary care on 10/20/2024 by Micheal Wolm ORN, MD.  Called Nurse Triage reporting Hand Pain.  Symptoms began today.  Interventions attempted: Nothing.  Symptoms are: unchanged.  Triage Disposition: See Physician Within 24 Hours  Patient/caregiver understands and will follow disposition?: Yes     Message from Aisha D sent at 01/13/2025 12:44 PM EST  Reason for Triage: Pt stated that she thinks her right middle finger is infected. Pt stated that she is experiencing swelling and pain in her finger.   Reason for Disposition  Looks infected (spreading redness, pus)  Answer Assessment - Initial Assessment Questions Pt called to report suspected hangnail/nail infection that started today. Pt reports R middle finger around nail bed is red, swollen and warm to touch. Pt reports minimal yellow pus present. Pt denies injury or any open wounds. Pt denies severe pain but that area is painful. No radiation of pain into hand, no bleeding at site. Appointment scheduled for evaluation. Patient agrees with plan of care, and will call back if anything changes, or if symptoms worsen.      1. ONSET: When did the pain start?      Today   2. LOCATION and RADIATION: Where is the pain located?  (e.g., fingertip, around nail, joint, entire      R middle finger around nail   3. SEVERITY: How bad is the pain? What does it keep you from doing?   (Scale 1-10; or mild, moderate, severe)     Mild to moderate   4. APPEARANCE: What does the finger look like? (e.g., redness, swelling, bruising, pallor)     Red, swelling, skin around nail bed inflamed; suspected torn hangnail   5. WORK OR EXERCISE: Has there been any recent work or exercise that involved this part (i.e., fingers or hand) of the body?     No   6. CAUSE: What do you think is causing the pain?     Suspect torn  hangnail  7. AGGRAVATING FACTORS: What makes the pain worse? (e.g., using computer)     N/a  8. OTHER SYMPTOMS: Do you have any other symptoms? (e.g., fever, neck pain, numbness)     No  Protocols used: Finger Pain-A-AH

## 2025-01-13 NOTE — Progress Notes (Signed)
 "  Established Patient Office Visit  Subjective   Patient ID: Jennifer Shepard, female    DOB: [birth year]  Age: 59 y.o. MRN: [id number]  Chief Complaint  Patient presents with   Finger Injury    HPI   Jennifer Shepard seen with right middle finger soreness and some swelling and mild erythema along the border specially next to the ring finger.  Denies any actual injury.  She does get manicures and has artificial nails but took nail off involved finger during the past week.  Denies any fevers or chills.  She has tried topical Neosporin without improvement.  She has had little bit of oozing drainage at night.  Mild pain.  She states she has had intolerance with doxycycline  with severe vomiting previously.  She also states she has had anaphylaxis possibly with possible angioedema to the lips previously with Ceclor  Past Medical History:  Diagnosis Date   Acute deep vein thrombosis (DVT) of left peroneal vein (HCC)    ADHD (attention deficit hyperactivity disorder)    Benign essential HTN    Cancer (HCC)    Thyroid    Hyperlipidemia    Hypothyroidism associated with surgical procedure    Migraine headache    Past Surgical History:  Procedure Laterality Date   APPENDECTOMY     BREAST CYST ASPIRATION Right pt unsure   no visible scar   CESAREAN SECTION  01/2004   CESAREAN SECTION  10/2005   INTRAUTERINE DEVICE INSERTION  06/26/2015   Mirena    LASIK     THYROIDECTOMY  07/2009   FOR THYROID  CANCER; Dr Graig    reports that she has never smoked. She has never used smokeless tobacco. She reports current alcohol use. She reports that she does not use drugs. family history includes CAD in her maternal grandmother; Hyperlipidemia in her father; Hypertension in her maternal grandmother and mother; Pancreatic cancer in her mother. Allergies[1]  Review of Systems  Constitutional:  Negative for chills and fever.      Objective:     BP (!) 140/80   Pulse 86   Temp (!) 97.5 F (36.4 C) (Oral)    Wt 212 lb 3.2 oz (96.3 kg)   LMP  (LMP Unknown)   SpO2 94%   BMI 31.34 kg/m  BP Readings from Last 3 Encounters:  01/13/25 (!) 140/80  12/05/24 (!) 146/100  10/20/24 (!) 150/90   Wt Readings from Last 3 Encounters:  01/13/25 212 lb 3.2 oz (96.3 kg)  12/05/24 214 lb 8 oz (97.3 kg)  10/20/24 208 lb 9.6 oz (94.6 kg)      Physical Exam Vitals reviewed.  Constitutional:      General: She is not in acute distress.    Appearance: She is not ill-appearing.  Cardiovascular:     Rate and Rhythm: Normal rate and regular rhythm.  Skin:    Comments: Right middle finger reveals some mild erythema and soft tissue swelling along the border next to the ring finger.  No visible purulence.  No significant fluctuance.  Mildly tender.  Neurological:     Mental Status: She is alert.      No results found for any visits on 01/13/25.    The 10-year ASCVD risk score (Arnett DK, et al., 2019) is: 3.5%    Assessment & Plan:   Problem List Items Addressed This Visit   None Visit Diagnoses       Paronychia of right middle finger    -  Primary  Paronychia right middle finger.  No indication for I&D at this time.  We recommended warm salt water soaks.  She has intolerance to doxycycline  and anaphylaxis with Ceclor so would avoid medications such as Keflex and penicillins with her possible anaphylaxis with Ceclor.  We did write for Septra  DS 1 twice daily and printed prescription to start if she is not seeing prompt improvement with warm salt water soaks over the next couple of days.  No follow-ups on file.    Wolm Scarlet, MD     [1]  Allergies Allergen Reactions   Cefaclor Other (See Comments)    Angioedema of lips   Morphine And Codeine Other (See Comments)    Severe headache   Doxycycline  Nausea And Vomiting   Tyloxapol    Oxycodone-Acetaminophen  Itching    itching   "
